# Patient Record
Sex: Female | Born: 2000 | Race: White | Hispanic: No | Marital: Single | Smoking: Never smoker
Health system: Southern US, Community
[De-identification: ages and names within clinical notes are randomized; demographics above are authoritative.]

## PROBLEM LIST (undated history)

## (undated) ENCOUNTER — Inpatient Hospital Stay (HOSPITAL_COMMUNITY): Payer: Self-pay

## (undated) DIAGNOSIS — A4902 Methicillin resistant Staphylococcus aureus infection, unspecified site: Secondary | ICD-10-CM

## (undated) DIAGNOSIS — J45909 Unspecified asthma, uncomplicated: Secondary | ICD-10-CM

## (undated) HISTORY — PX: WISDOM TOOTH EXTRACTION: SHX21

## (undated) HISTORY — DX: Unspecified asthma, uncomplicated: J45.909

---
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
## 2018-01-05 ENCOUNTER — Other Ambulatory Visit: Payer: Medicaid Other

## 2018-01-05 ENCOUNTER — Ambulatory Visit (INDEPENDENT_AMBULATORY_CARE_PROVIDER_SITE_OTHER): Payer: Medicaid Other | Admitting: Obstetrics & Gynecology

## 2018-01-05 ENCOUNTER — Encounter: Payer: Self-pay | Admitting: Obstetrics & Gynecology

## 2018-01-05 VITALS — BP 110/70 | HR 99 | Wt 193.0 lb

## 2018-01-05 DIAGNOSIS — O365920 Maternal care for other known or suspected poor fetal growth, second trimester, not applicable or unspecified: Secondary | ICD-10-CM

## 2018-01-05 DIAGNOSIS — Z331 Pregnant state, incidental: Secondary | ICD-10-CM

## 2018-01-05 DIAGNOSIS — Z1389 Encounter for screening for other disorder: Secondary | ICD-10-CM

## 2018-01-05 DIAGNOSIS — Z3A22 22 weeks gestation of pregnancy: Secondary | ICD-10-CM

## 2018-01-05 DIAGNOSIS — Z3402 Encounter for supervision of normal first pregnancy, second trimester: Secondary | ICD-10-CM

## 2018-01-05 LAB — POCT URINALYSIS DIPSTICK
Blood, UA: NEGATIVE
Glucose, UA: NEGATIVE
Ketones, UA: NEGATIVE
Leukocytes, UA: NEGATIVE
Nitrite, UA: NEGATIVE
Protein, UA: NEGATIVE

## 2018-01-05 NOTE — Progress Notes (Signed)
Patient ID: Yvonne Ayala, female   DOB: 10/03/2000, 17 y.o.   MRN: [id number] G1P0 3854w3d Estimated Date of Delivery: 05/08/18  Blood pressure 110/70, pulse 99, weight 193 lb (87.5 kg), last menstrual period 08/01/2017.   BP weight and urine results all reviewed and noted.  Please refer to the obstetrical flow sheet for the fundal height and fetal heart rate documentation:  Patient reports good fetal movement, denies any bleeding and no rupture of membranes symptoms or regular contractions. Patient is without complaints. All questions were answered.  Orders Placed This Encounter  Procedures  . US OB Follow Up  . POCT urinalysis dipstick    Plan:  Continued routine obstetrical care, sonogram for EFW next visit, PN2  Return in about 1 month (around 02/02/2018) for sonogram for growth, PN2, LROB.

## 2018-02-02 ENCOUNTER — Encounter: Payer: Self-pay | Admitting: Women's Health

## 2018-02-02 ENCOUNTER — Other Ambulatory Visit: Payer: Self-pay

## 2018-02-02 ENCOUNTER — Ambulatory Visit (INDEPENDENT_AMBULATORY_CARE_PROVIDER_SITE_OTHER): Payer: Medicaid Other | Admitting: Women's Health

## 2018-02-02 ENCOUNTER — Other Ambulatory Visit: Payer: Medicaid Other

## 2018-02-02 ENCOUNTER — Ambulatory Visit (INDEPENDENT_AMBULATORY_CARE_PROVIDER_SITE_OTHER): Payer: Medicaid Other

## 2018-02-02 VITALS — BP 100/60 | HR 101 | Wt 204.0 lb

## 2018-02-02 DIAGNOSIS — Z131 Encounter for screening for diabetes mellitus: Secondary | ICD-10-CM

## 2018-02-02 DIAGNOSIS — Z3402 Encounter for supervision of normal first pregnancy, second trimester: Secondary | ICD-10-CM

## 2018-02-02 DIAGNOSIS — L299 Pruritus, unspecified: Secondary | ICD-10-CM

## 2018-02-02 DIAGNOSIS — Z1389 Encounter for screening for other disorder: Secondary | ICD-10-CM

## 2018-02-02 DIAGNOSIS — Z331 Pregnant state, incidental: Secondary | ICD-10-CM

## 2018-02-02 DIAGNOSIS — O365920 Maternal care for other known or suspected poor fetal growth, second trimester, not applicable or unspecified: Secondary | ICD-10-CM

## 2018-02-02 DIAGNOSIS — O26892 Other specified pregnancy related conditions, second trimester: Secondary | ICD-10-CM

## 2018-02-02 DIAGNOSIS — Z3A26 26 weeks gestation of pregnancy: Secondary | ICD-10-CM

## 2018-02-02 LAB — POCT URINALYSIS DIPSTICK
Blood, UA: NEGATIVE
Glucose, UA: NEGATIVE
KETONES UA: NEGATIVE
Leukocytes, UA: NEGATIVE
NITRITE UA: NEGATIVE
PROTEIN UA: NEGATIVE

## 2018-02-02 NOTE — Progress Notes (Signed)
   LOW-RISK PREGNANCY VISIT Patient name: Yvonne Ayala MRN [id number]  Date of birth: [birth year] Chief Complaint:   Routine Prenatal Visit (PN2 & u/s today)  History of Present Illness:   Brady Plant is a 17 y.o. G1P0 female at [redacted]w[redacted]d with an Estimated Date of Delivery: 05/08/18 being seen today for ongoing management of a low-risk pregnancy.  Today she reports few circular areas on upper chest, occ itchy.  . Vag. Bleeding: None.  Movement: Present. denies leaking of fluid. Review of Systems:   Pertinent items are noted in HPI Denies abnormal vaginal discharge w/ itching/odor/irritation, headaches, visual changes, shortness of breath, chest pain, abdominal pain, severe nausea/vomiting, or problems with urination or bowel movements unless otherwise stated above. Pertinent History Reviewed:  Reviewed past medical,surgical, social, obstetrical and family history.  Reviewed problem list, medications and allergies. Physical Assessment:   Vitals:   02/02/18 1000  BP: 100/60  Pulse: (!) 101  Weight: 204 lb (92.5 kg)  Body mass index is 30.84 kg/m.        Physical Examination:   General appearance: Well appearing, and in no distress  Mental status: Alert, oriented to person, place, and time  Skin: Warm & dry, few circular non-raised spots upper chest between breasts  Cardiovascular: Normal heart rate noted  Respiratory: Normal respiratory effort, no distress  Abdomen: Soft, gravid, nontender  Pelvic: Cervical exam deferred         Extremities: Edema: None  Fetal Status: Fetal Heart Rate (bpm): 133 u/s Fundal Height: 26 cm Movement: Present     [place] 26+3 wks,cephalic,fhr 133 bpm,cx 4.3 cm,posterior pl gr 0,bilat adnexa's wnl,svp of fluid 6 cm,EFW 923 g 35% (done b/c efw low-normal at anatomy u/s)  Results for orders placed or performed in visit on 02/02/18 (from the past 24 hour(s))  POCT urinalysis dipstick   Collection Time: 02/02/18 10:03 AM  Result Value Ref Range   Color, UA      Clarity, UA     Glucose, UA neg    Bilirubin, UA     Ketones, UA neg    Spec Grav, UA  1.010 - 1.025   Blood, UA neg    pH, UA  5.0 - 8.0   Protein, UA neg    Urobilinogen, UA  0.2 or 1.0 E.U./dL   Nitrite, UA neg    Leukocytes, UA Negative Negative   Appearance     Odor      Assessment & Plan:  1) Low-risk pregnancy G1P0 at [redacted]w[redacted]d with an Estimated Date of Delivery: 05/08/18   2) Spots on skin upper chest, continue to monitor, can try hydrocortisone cream if needed for itching   Meds: No orders of the defined types were placed in this encounter.  Labs/procedures today: pn2, u/s  Plan:  Continue routine obstetrical care   Reviewed: Preterm labor symptoms and general obstetric precautions including but not limited to vaginal bleeding, contractions, leaking of fluid and fetal movement were reviewed in detail with the patient.  Recommended Tdap at HD/PCP per CDC guidelines. All questions were answered  Follow-up: Return in about 1 month (around 03/02/2018) for LROB.  Orders Placed This Encounter  Procedures  . POCT urinalysis dipstick   Cheral Marker CNM, [facility] 02/02/2018 10:25 AM

## 2018-02-02 NOTE — Progress Notes (Signed)
[place] 26+3 wks,cephalic,fhr 133 bpm,cx 4.3 cm,posterior pl gr 0,bilat adnexa's wnl,svp of fluid 6 cm,EFW 923 g 35%

## 2018-02-02 NOTE — Patient Instructions (Signed)
Yvonne Ayala, I greatly value your feedback.  If you receive a survey following your visit with [place] today, we appreciate you taking the time to fill it out.  Thanks, [name], CNM, WHNP-BC   Call the office [phone]) or go to [facility] if:  You begin to have strong, frequent contractions  Your water breaks.  Sometimes it is a big gush of fluid, sometimes it is just a trickle that keeps getting your panties wet or running down your legs  You have vaginal bleeding.  It is normal to have a small amount of spotting if your cervix was checked.   You don't feel your baby moving like normal.  If you don't, get you something to eat and drink and lay down and focus on feeling your baby move.  You should feel at least 10 movements in 2 hours.  If you don't, you should call the office or go to [facility].    Tdap Vaccine  It is recommended that you get the Tdap vaccine during the third trimester of EACH pregnancy to help protect your baby from getting pertussis (whooping cough)  27-36 weeks is the BEST time to do this so that you can pass the protection on to your baby. During pregnancy is better than after pregnancy, but if you are unable to get it during pregnancy it will be offered at the hospital.   You can get this vaccine at the health department or your family doctor  Everyone who will be around your baby should also be up-to-date on their vaccines. Adults (who are not pregnant) only need 1 dose of Tdap during adulthood.   Third Trimester of Pregnancy The third trimester is from week 29 through week 42, months 7 through 9. The third trimester is a time when the fetus is growing rapidly. At the end of the ninth month, the fetus is about 20 inches in length and weighs 6-10 pounds.  BODY CHANGES Your body goes through many changes during pregnancy. The changes vary from woman to woman.   Your weight will continue to increase. You can expect to gain 25-35 pounds (11-16 kg) by  the end of the pregnancy.  You may begin to get stretch marks on your hips, abdomen, and breasts.  You may urinate more often because the fetus is moving lower into your pelvis and pressing on your bladder.  You may develop or continue to have heartburn as a result of your pregnancy.  You may develop constipation because certain hormones are causing the muscles that push waste through your intestines to slow down.  You may develop hemorrhoids or swollen, bulging veins (varicose veins).  You may have pelvic pain because of the weight gain and pregnancy hormones relaxing your joints between the bones in your pelvis. Backaches may result from overexertion of the muscles supporting your posture.  You may have changes in your hair. These can include thickening of your hair, rapid growth, and changes in texture. Some women also have hair loss during or after pregnancy, or hair that feels dry or thin. Your hair will most likely return to normal after your baby is born.  Your breasts will continue to grow and be tender. A yellow discharge may leak from your breasts called colostrum.  Your belly button may stick out.  You may feel short of breath because of your expanding uterus.  You may notice the fetus "dropping," or moving lower in your abdomen.  You may have a bloody mucus  discharge. This usually occurs a few days to a week before labor begins.  Your cervix becomes thin and soft (effaced) near your due date. WHAT TO EXPECT AT YOUR PRENATAL EXAMS  You will have prenatal exams every 2 weeks until week 36. Then, you will have weekly prenatal exams. During a routine prenatal visit:  You will be weighed to make sure you and the fetus are growing normally.  Your blood pressure is taken.  Your abdomen will be measured to track your baby's growth.  The fetal heartbeat will be listened to.  Any test results from the previous visit will be discussed.  You may have a cervical check near your  due date to see if you have effaced. At around 36 weeks, your caregiver will check your cervix. At the same time, your caregiver will also perform a test on the secretions of the vaginal tissue. This test is to determine if a type of bacteria, Group B streptococcus, is present. Your caregiver will explain this further. Your caregiver may ask you:  What your birth plan is.  How you are feeling.  If you are feeling the baby move.  If you have had any abnormal symptoms, such as leaking fluid, bleeding, severe headaches, or abdominal cramping.  If you have any questions. Other tests or screenings that may be performed during your third trimester include:  Blood tests that check for low iron levels (anemia).  Fetal testing to check the health, activity level, and growth of the fetus. Testing is done if you have certain medical conditions or if there are problems during the pregnancy. FALSE LABOR You may feel small, irregular contractions that eventually go away. These are called Braxton Hicks contractions, or false labor. Contractions may last for hours, days, or even weeks before true labor sets in. If contractions come at regular intervals, intensify, or become painful, it is best to be seen by your caregiver.  SIGNS OF LABOR   Menstrual-like cramps.  Contractions that are 5 minutes apart or less.  Contractions that start on the top of the uterus and spread down to the lower abdomen and back.  A sense of increased pelvic pressure or back pain.  A watery or bloody mucus discharge that comes from the vagina. If you have any of these signs before the 37th week of pregnancy, call your caregiver right away. You need to go to the hospital to get checked immediately. HOME CARE INSTRUCTIONS   Avoid all smoking, herbs, alcohol, and unprescribed drugs. These chemicals affect the formation and growth of the baby.  Follow your caregiver's instructions regarding medicine use. There are medicines  that are either safe or unsafe to take during pregnancy.  Exercise only as directed by your caregiver. Experiencing uterine cramps is a good sign to stop exercising.  Continue to eat regular, healthy meals.  Wear a good support bra for breast tenderness.  Do not use hot tubs, steam rooms, or saunas.  Wear your seat belt at all times when driving.  Avoid raw meat, uncooked cheese, cat litter boxes, and soil used by cats. These carry germs that can cause birth defects in the baby.  Take your prenatal vitamins.  Try taking a stool softener (if your caregiver approves) if you develop constipation. Eat more high-fiber foods, such as fresh vegetables or fruit and whole grains. Drink plenty of fluids to keep your urine clear or pale yellow.  Take warm sitz baths to soothe any pain or discomfort caused by hemorrhoids. Use  hemorrhoid cream if your caregiver approves.  If you develop varicose veins, wear support hose. Elevate your feet for 15 minutes, 3-4 times a day. Limit salt in your diet.  Avoid heavy lifting, wear low heal shoes, and practice good posture.  Rest a lot with your legs elevated if you have leg cramps or low back pain.  Visit your dentist if you have not gone during your pregnancy. Use a soft toothbrush to brush your teeth and be gentle when you floss.  A sexual relationship may be continued unless your caregiver directs you otherwise.  Do not travel far distances unless it is absolutely necessary and only with the approval of your caregiver.  Take prenatal classes to understand, practice, and ask questions about the labor and delivery.  Make a trial run to the hospital.  Pack your hospital bag.  Prepare the baby's nursery.  Continue to go to all your prenatal visits as directed by your caregiver. SEEK MEDICAL CARE IF:  You are unsure if you are in labor or if your water has broken.  You have dizziness.  You have mild pelvic cramps, pelvic pressure, or nagging  pain in your abdominal area.  You have persistent nausea, vomiting, or diarrhea.  You have a bad smelling vaginal discharge.  You have pain with urination. SEEK IMMEDIATE MEDICAL CARE IF:   You have a fever.  You are leaking fluid from your vagina.  You have spotting or bleeding from your vagina.  You have severe abdominal cramping or pain.  You have rapid weight loss or gain.  You have shortness of breath with chest pain.  You notice sudden or extreme swelling of your face, hands, ankles, feet, or legs.  You have not felt your baby move in over an hour.  You have severe headaches that do not go away with medicine.  You have vision changes. Document Released: 09/13/2001 Document Revised: 09/24/2013 Document Reviewed: 11/20/2012 [facility] Patient Information 2015 [place], [place]. This information is not intended to replace advice given to you by your health care provider. Make sure you discuss any questions you have with your health care provider.

## 2018-02-03 LAB — GLUCOSE TOLERANCE, 2 HOURS W/ 1HR
GLUCOSE, 1 HOUR: 96 mg/dL (ref 65–179)
Glucose, 2 hour: 113 mg/dL (ref 65–152)
Glucose, Fasting: 80 mg/dL (ref 65–91)

## 2018-02-03 LAB — CBC
HEMATOCRIT: 35.2 % (ref 34.0–46.6)
Hemoglobin: 11.5 g/dL (ref 11.1–15.9)
MCH: 29.8 pg (ref 26.6–33.0)
MCHC: 32.7 g/dL (ref 31.5–35.7)
MCV: 91 fL (ref 79–97)
PLATELETS: 206 10*3/uL (ref 150–379)
RBC: 3.86 x10E6/uL (ref 3.77–5.28)
RDW: 14.1 % (ref 12.3–15.4)
WBC: 9.8 10*3/uL (ref 3.4–10.8)

## 2018-02-03 LAB — ANTIBODY SCREEN: ANTIBODY SCREEN: NEGATIVE

## 2018-02-03 LAB — HIV ANTIBODY (ROUTINE TESTING W REFLEX): HIV Screen 4th Generation wRfx: NONREACTIVE

## 2018-02-03 LAB — RPR: RPR Ser Ql: NONREACTIVE

## 2018-02-06 ENCOUNTER — Telehealth: Payer: Self-pay | Admitting: *Deleted

## 2018-02-06 NOTE — Telephone Encounter (Signed)
Patient states she is having some pulling type pain in her lower abdomen more on the sides.  She has been moving today but not more that usual.  Informed patient that it sounded like she was having round ligament pain which is normal during pregnancy.  Advised if the pain became more frequent and across her lower abdomen or she started having any bleeding, to call us or go to [facility].  Patient verbalized understanding.

## 2018-03-02 ENCOUNTER — Ambulatory Visit (INDEPENDENT_AMBULATORY_CARE_PROVIDER_SITE_OTHER): Payer: Medicaid Other | Admitting: Obstetrics & Gynecology

## 2018-03-02 ENCOUNTER — Other Ambulatory Visit: Payer: Self-pay

## 2018-03-02 ENCOUNTER — Encounter: Payer: Self-pay | Admitting: Obstetrics & Gynecology

## 2018-03-02 VITALS — BP 116/70 | HR 93 | Wt 215.0 lb

## 2018-03-02 DIAGNOSIS — Z3A30 30 weeks gestation of pregnancy: Secondary | ICD-10-CM

## 2018-03-02 DIAGNOSIS — Z3403 Encounter for supervision of normal first pregnancy, third trimester: Secondary | ICD-10-CM

## 2018-03-02 DIAGNOSIS — Z331 Pregnant state, incidental: Secondary | ICD-10-CM

## 2018-03-02 DIAGNOSIS — Z1389 Encounter for screening for other disorder: Secondary | ICD-10-CM

## 2018-03-02 LAB — POCT URINALYSIS DIPSTICK
Blood, UA: NEGATIVE
GLUCOSE UA: NEGATIVE
Ketones, UA: NEGATIVE
LEUKOCYTES UA: NEGATIVE
Nitrite, UA: NEGATIVE
Protein, UA: NEGATIVE

## 2018-03-02 NOTE — Progress Notes (Signed)
G1P0 2272w3d Estimated Date of Delivery: 05/08/18  Blood pressure 116/70, pulse 93, weight 215 lb (97.5 kg), last menstrual period 08/01/2017.   BP weight and urine results all reviewed and noted.  Please refer to the obstetrical flow sheet for the fundal height and fetal heart rate documentation:  Patient reports good fetal movement, denies any bleeding and no rupture of membranes symptoms or regular contractions. Patient is without complaints. All questions were answered.  Orders Placed This Encounter  Procedures  . POCT urinalysis dipstick    Plan:  Continued routine obstetrical care,   Return in about 2 weeks (around 03/16/2018) for LROB.

## 2018-03-16 ENCOUNTER — Ambulatory Visit (INDEPENDENT_AMBULATORY_CARE_PROVIDER_SITE_OTHER): Payer: Medicaid Other | Admitting: Obstetrics & Gynecology

## 2018-03-16 ENCOUNTER — Encounter: Payer: Self-pay | Admitting: Obstetrics & Gynecology

## 2018-03-16 VITALS — BP 108/65 | HR 90 | Wt 218.0 lb

## 2018-03-16 DIAGNOSIS — Z3A32 32 weeks gestation of pregnancy: Secondary | ICD-10-CM

## 2018-03-16 DIAGNOSIS — Z1389 Encounter for screening for other disorder: Secondary | ICD-10-CM

## 2018-03-16 DIAGNOSIS — Z23 Encounter for immunization: Secondary | ICD-10-CM

## 2018-03-16 DIAGNOSIS — Z3403 Encounter for supervision of normal first pregnancy, third trimester: Secondary | ICD-10-CM

## 2018-03-16 DIAGNOSIS — Z331 Pregnant state, incidental: Secondary | ICD-10-CM

## 2018-03-16 LAB — POCT URINALYSIS DIPSTICK
GLUCOSE UA: NEGATIVE
KETONES UA: NEGATIVE
Leukocytes, UA: NEGATIVE
NITRITE UA: NEGATIVE
Protein, UA: NEGATIVE
RBC UA: NEGATIVE

## 2018-03-16 NOTE — Progress Notes (Signed)
G1P0 240w3d Estimated Date of Delivery: 05/08/18  Blood pressure 108/65, pulse 90, weight 218 lb (98.9 kg), last menstrual period 08/01/2017.   BP weight and urine results all reviewed and noted.  Please refer to the obstetrical flow sheet for the fundal height and fetal heart rate documentation:  Patient reports good fetal movement, denies any bleeding and no rupture of membranes symptoms or regular contractions. Patient is without complaints. All questions were answered.  Orders Placed This Encounter  Procedures  . POCT urinalysis dipstick    Plan:  Continued routine obstetrical care,   Return in about 2 weeks (around 03/30/2018) for LROB.

## 2018-03-20 ENCOUNTER — Other Ambulatory Visit: Payer: Self-pay

## 2018-03-20 ENCOUNTER — Telehealth: Payer: Self-pay | Admitting: *Deleted

## 2018-03-20 ENCOUNTER — Encounter (HOSPITAL_COMMUNITY): Payer: Self-pay | Admitting: *Deleted

## 2018-03-20 ENCOUNTER — Observation Stay (HOSPITAL_COMMUNITY)
Admission: AD | Admit: 2018-03-20 | Discharge: 2018-03-21 | Disposition: A | Discharge status: Home / Self Care | Payer: Medicaid Other | Attending: Family Medicine | Admitting: Family Medicine

## 2018-03-20 DIAGNOSIS — Z043 Encounter for examination and observation following other accident: Secondary | ICD-10-CM | POA: Diagnosis not present

## 2018-03-20 DIAGNOSIS — J45909 Unspecified asthma, uncomplicated: Secondary | ICD-10-CM | POA: Insufficient documentation

## 2018-03-20 DIAGNOSIS — O99513 Diseases of the respiratory system complicating pregnancy, third trimester: Secondary | ICD-10-CM | POA: Insufficient documentation

## 2018-03-20 DIAGNOSIS — Z8614 Personal history of Methicillin resistant Staphylococcus aureus infection: Secondary | ICD-10-CM | POA: Insufficient documentation

## 2018-03-20 DIAGNOSIS — M545 Low back pain: Secondary | ICD-10-CM | POA: Diagnosis not present

## 2018-03-20 DIAGNOSIS — O26893 Other specified pregnancy related conditions, third trimester: Secondary | ICD-10-CM | POA: Insufficient documentation

## 2018-03-20 DIAGNOSIS — M549 Dorsalgia, unspecified: Secondary | ICD-10-CM

## 2018-03-20 DIAGNOSIS — N858 Other specified noninflammatory disorders of uterus: Secondary | ICD-10-CM

## 2018-03-20 DIAGNOSIS — W19XXXA Unspecified fall, initial encounter: Secondary | ICD-10-CM | POA: Insufficient documentation

## 2018-03-20 DIAGNOSIS — R10814 Left lower quadrant abdominal tenderness: Secondary | ICD-10-CM | POA: Insufficient documentation

## 2018-03-20 DIAGNOSIS — Z881 Allergy status to other antibiotic agents status: Secondary | ICD-10-CM | POA: Diagnosis not present

## 2018-03-20 DIAGNOSIS — O99891 Other specified diseases and conditions complicating pregnancy: Secondary | ICD-10-CM

## 2018-03-20 DIAGNOSIS — R110 Nausea: Secondary | ICD-10-CM | POA: Insufficient documentation

## 2018-03-20 DIAGNOSIS — O9989 Other specified diseases and conditions complicating pregnancy, childbirth and the puerperium: Secondary | ICD-10-CM

## 2018-03-20 DIAGNOSIS — Z3A33 33 weeks gestation of pregnancy: Secondary | ICD-10-CM | POA: Diagnosis not present

## 2018-03-20 DIAGNOSIS — N949 Unspecified condition associated with female genital organs and menstrual cycle: Secondary | ICD-10-CM

## 2018-03-20 DIAGNOSIS — N859 Noninflammatory disorder of uterus, unspecified: Secondary | ICD-10-CM

## 2018-03-20 LAB — URINALYSIS, ROUTINE W REFLEX MICROSCOPIC
Bilirubin Urine: NEGATIVE
GLUCOSE, UA: NEGATIVE mg/dL
Hgb urine dipstick: NEGATIVE
Ketones, ur: NEGATIVE mg/dL
Nitrite: NEGATIVE
PROTEIN: NEGATIVE mg/dL
Specific Gravity, Urine: 1.008 (ref 1.005–1.030)
pH: 6 (ref 5.0–8.0)

## 2018-03-20 LAB — TYPE AND SCREEN
ABO/RH(D): A POS
Antibody Screen: NEGATIVE

## 2018-03-20 LAB — CBC
HEMATOCRIT: 35.7 % — AB (ref 36.0–46.0)
Hemoglobin: 12.1 g/dL (ref 12.0–15.0)
MCH: 30.3 pg (ref 26.0–34.0)
MCHC: 33.9 g/dL (ref 30.0–36.0)
MCV: 89.3 fL (ref 78.0–100.0)
Platelets: 206 10*3/uL (ref 150–400)
RBC: 4 MIL/uL (ref 3.87–5.11)
RDW: 13.6 % (ref 11.5–15.5)
WBC: 13 10*3/uL — AB (ref 4.0–10.5)

## 2018-03-20 LAB — MRSA PCR SCREENING: MRSA by PCR: NEGATIVE

## 2018-03-20 LAB — ABO/RH: ABO/RH(D): A POS

## 2018-03-20 MED ORDER — ZOLPIDEM TARTRATE 5 MG PO TABS: 5.0000 mg | ORAL_TABLET | Freq: Every evening | ORAL | Status: DC | PRN

## 2018-03-20 MED ORDER — PRENATAL MULTIVITAMIN CH: 1.0000 | ORAL_TABLET | Freq: Every day | ORAL | Status: DC

## 2018-03-20 MED ORDER — DOCUSATE SODIUM 100 MG PO CAPS: 100.0000 mg | ORAL_CAPSULE | Freq: Every day | ORAL | Status: DC

## 2018-03-20 MED ORDER — CALCIUM CARBONATE ANTACID 500 MG PO CHEW: 2.0000 | CHEWABLE_TABLET | ORAL | Status: DC | PRN

## 2018-03-20 MED ORDER — CYCLOBENZAPRINE HCL 5 MG PO TABS
5.0000 mg | ORAL_TABLET | Freq: Three times a day (TID) | ORAL | Status: DC | PRN
  Administered 2018-03-20 – 2018-03-21 (×2): 5 mg via ORAL
  Filled 2018-03-20 (×3): qty 1

## 2018-03-20 MED ORDER — ACETAMINOPHEN 325 MG PO TABS: 650.0000 mg | ORAL_TABLET | ORAL | Status: DC | PRN

## 2018-03-20 NOTE — MAU Note (Signed)
Pt reports lower back pain after a fall about 1.5 hours ago, denies bleeding , positive fetal movement.

## 2018-03-20 NOTE — Telephone Encounter (Signed)
Pt called in stating that she fell today. Pt is [redacted] wks pregnant.   Discussed with Marylu LundJanet and pt was advised to [facility] to [facility].  Pt voiced understanding.  03-20-18  AS

## 2018-03-20 NOTE — H&P (Signed)
Yvonne KarvonenMakayla Tamanna Ayala is a 17 y.o. female presenting for low back pain and lower abdominal pain secondary to a fall she had today.  Landed on her buttocks and back.  No bleeding.  Good fetal movement. .  RN note: Pt reports lower back pain after a fall about 1.5 hours ago, denies bleeding , positive fetal movement.   OB History    Gravida  1   Para      Term      Preterm      AB      Living        SAB      TAB      Ectopic      Multiple      Live Births             Past Medical History:  Diagnosis Date  . Asthma    as a child  . Mental disorder   . MRSA infection    Past Surgical History:  Procedure Laterality Date  . NO PAST SURGERIES    . WISDOM TOOTH EXTRACTION     Family History: family history is not on file. She was adopted. Social History:  reports that she has never smoked. She has never used smokeless tobacco. She reports that she does not drink alcohol or use drugs.     Maternal Diabetes: No Genetic Screening: Normal Maternal Ultrasounds/Referrals: Normal Fetal Ultrasounds or other Referrals:  None Maternal Substance Abuse:  No Significant Maternal Medications:  None Significant Maternal Lab Results:  None Other Comments:  S/P Fall with contractions and tenderness  Review of Systems  Constitutional: Negative for chills and fever.  Eyes: Negative for blurred vision and double vision.  Respiratory: Negative for shortness of breath.   Cardiovascular: Negative for leg swelling.  Gastrointestinal: Positive for abdominal pain. Negative for constipation, diarrhea, nausea and vomiting.  Musculoskeletal: Positive for back pain.  Neurological: Negative for dizziness and weakness.   Maternal Medical History:  Reason for admission: Contractions.  Nausea.  Contractions: Onset was 1-2 hours ago.   Frequency: irregular.   Perceived severity is mild.    Fetal activity: Perceived fetal activity is normal.   Last perceived fetal movement was within the  past hour.    Prenatal complications: No bleeding, PIH, infection or placental abnormality.   Prenatal Complications - Diabetes: none.      Blood pressure 126/74, pulse (!) 103, temperature 98.5 F (36.9 C), temperature source Oral, resp. rate 16, height 5' 8.5" (1.74 m), weight 222 lb (100.7 kg), last menstrual period 08/01/2017, SpO2 100 %. Maternal Exam:  Uterine Assessment: Contraction strength is mild.  Contraction frequency is irregular.   Abdomen: Patient reports the following abdominal tenderness: LLQ and RLQ.  Fundal height is 33.    Introitus: Ferning test: not done.  Nitrazine test: not done. Amniotic fluid character: not assessed.     Fetal Exam Fetal Monitor Review: Mode: ultrasound.   Baseline rate: 140.  Variability: moderate (6-25 bpm).   Pattern: accelerations present and no decelerations.    Fetal State Assessment: Category I - tracings are normal.     Physical Exam  Constitutional: She is oriented to person, place, and time. She appears well-developed and well-nourished. No distress.  HENT:  Head: Normocephalic.  Cardiovascular: Normal rate and regular rhythm.  Respiratory: Effort normal. No respiratory distress. She has no wheezes. She has no rales.  GI: Soft. There is tenderness in the right lower quadrant and left lower quadrant. There is no  rebound and no guarding.  Musculoskeletal: Normal range of motion.  Neurological: She is alert and oriented to person, place, and time.  Skin: Skin is warm and dry.  Psychiatric: She has a normal mood and affect.    Prenatal labs: ABO, Rh: A/Positive/-- (01/14 1505) Antibody: Negative (05/03 0846) Rubella: 6.78 (01/14 1505) RPR: Non Reactive (05/03 0846)  HBsAg: Negative (01/14 1505)  HIV: Non Reactive (05/03 0846)  GBS:     Assessment/Plan: [place] IUP at [redacted]w[redacted]d S/P fall  Lower abdominal tenderness Low back pain Frequent uterine contractions  Admit to Antenatal per consult Dr Adrian Blackwater Routine  orders Flexeril prn back pain EFM and tocometry Probable discharge in AM if Stable   Wynelle Bourgeois 03/20/2018, 7:11 PM

## 2018-03-21 DIAGNOSIS — O99891 Other specified diseases and conditions complicating pregnancy: Secondary | ICD-10-CM

## 2018-03-21 DIAGNOSIS — M549 Dorsalgia, unspecified: Secondary | ICD-10-CM | POA: Diagnosis not present

## 2018-03-21 DIAGNOSIS — O9989 Other specified diseases and conditions complicating pregnancy, childbirth and the puerperium: Secondary | ICD-10-CM | POA: Diagnosis not present

## 2018-03-21 DIAGNOSIS — W19XXXA Unspecified fall, initial encounter: Secondary | ICD-10-CM | POA: Diagnosis not present

## 2018-03-21 MED ORDER — CYCLOBENZAPRINE HCL 5 MG PO TABS: 5.0000 mg | ORAL_TABLET | Freq: Three times a day (TID) | ORAL | 2 refills | Status: DC | PRN

## 2018-03-21 NOTE — Discharge Summary (Signed)
Antenatal Physician Discharge Summary  Patient ID: Yvonne Ayala MRN: [id number] DOB/AGE: 19/06/2001 19 y.o.  Admit date: 03/20/2018 Discharge date: 03/21/2018  Admission Diagnoses: Fall at 3760w0d  Discharge Diagnoses: The same  Prenatal Procedures: NST  Hospital Course:  This is a 17 y.o. G1P0 with IUP at 6260w0d admitted for observation after a fall into her back at home. No abdominal trauma.  She was admitted with some contractions, but these stopped during observation.  No leaking of fluid and no bleeding.  The fetal heart rate monitoring remained reassuring, and she had no signs/symptoms of progressing preterm labor or other maternal-fetal concerns.  Her cervical exam was unchanged from admission.  She was deemed stable for discharge to home with outpatient follow up.  Discharge Exam: Temp:  [98 F (36.7 C)-98.6 F (37 C)] 98 F (36.7 C) (06/19 0811) Pulse Rate:  [71-103] 71 (06/19 0811) Resp:  [16-18] 17 (06/19 0811) BP: (98-126)/(66-77) 98/66 (06/19 0811) SpO2:  [95 %-100 %] 98 % (06/19 0811) Weight:  [222 lb (100.7 kg)] 222 lb (100.7 kg) (06/18 1638) Physical Examination: CONSTITUTIONAL: Well-developed, well-nourished female in no acute distress.  HENT:  Normocephalic, atraumatic, External right and left ear normal. Oropharynx is clear and moist EYES: Conjunctivae and EOM are normal. Pupils are equal, round, and reactive to light. No scleral icterus.  NECK: Normal range of motion, supple, no masses SKIN: Skin is warm and dry. No rash noted. Not diaphoretic. No erythema. No pallor. NEUROLGIC: Alert and oriented to person, place, and time. Normal reflexes, muscle tone coordination. No cranial nerve deficit noted. PSYCHIATRIC: Normal mood and affect. Normal behavior. Normal judgment and thought content. CARDIOVASCULAR: Normal heart rate noted, regular rhythm RESPIRATORY: Effort and breath sounds normal, no problems with respiration noted MUSCULOSKELETAL: Normal range of motion.  No edema and no tenderness. 2+ distal pulses. ABDOMEN: Soft, nontender, nondistended, gravid. CERVIX:  Deferred  Fetal monitoring: FHR: 135 bpm, Variability: moderate, Accelerations: Present, Decelerations: Absent  Uterine activity:No contractions  Significant Diagnostic Studies:  Results for orders placed or performed during the hospital encounter of 03/20/18 (from the past 168 hour(s))  Urinalysis, Routine w reflex microscopic   Collection Time: 03/20/18  4:43 PM  Result Value Ref Range   Color, Urine YELLOW YELLOW   APPearance HAZY (A) CLEAR   Specific Gravity, Urine 1.008 1.005 - 1.030   pH 6.0 5.0 - 8.0   Glucose, UA NEGATIVE NEGATIVE mg/dL   Hgb urine dipstick NEGATIVE NEGATIVE   Bilirubin Urine NEGATIVE NEGATIVE   Ketones, ur NEGATIVE NEGATIVE mg/dL   Protein, ur NEGATIVE NEGATIVE mg/dL   Nitrite NEGATIVE NEGATIVE   Leukocytes, UA SMALL (A) NEGATIVE   RBC / HPF 0-5 0 - 5 RBC/hpf   WBC, UA 6-10 0 - 5 WBC/hpf   Bacteria, UA MANY (A) NONE SEEN   Squamous Epithelial / LPF 6-10 0 - 5   Mucus PRESENT   CBC on admission   Collection Time: 03/20/18  7:24 PM  Result Value Ref Range   WBC 13.0 (H) 4.0 - 10.5 K/uL   RBC 4.00 3.87 - 5.11 MIL/uL   Hemoglobin 12.1 12.0 - 15.0 g/dL   HCT 40.935.7 (L) 81.136.0 - 91.446.0 %   MCV 89.3 78.0 - 100.0 fL   MCH 30.3 26.0 - 34.0 pg   MCHC 33.9 30.0 - 36.0 g/dL   RDW 78.213.6 95.611.5 - 21.315.5 %   Platelets 206 150 - 400 K/uL  Type and screen [facility]   Collection Time: 03/20/18  7:24 PM  Result Value Ref Range   ABO/RH(D) A POS    Antibody Screen NEG    Sample Expiration      03/23/2018 Performed at [facility], [address], [place], [place] [address]   ABO/Rh   Collection Time: 03/20/18  7:24 PM  Result Value Ref Range   ABO/RH(D)      A POS Performed at [facility], [address], [place], [place] [address]   MRSA PCR Screening   Collection Time: 03/20/18  8:19 PM  Result Value Ref Range   MRSA by PCR  NEGATIVE NEGATIVE  Results for orders placed or performed in visit on 03/16/18 (from the past 168 hour(s))  POCT urinalysis dipstick   Collection Time: 03/16/18 10:27 AM  Result Value Ref Range   Color, UA     Clarity, UA     Glucose, UA Negative Negative   Bilirubin, UA     Ketones, UA neg    Spec Grav, UA  1.010 - 1.025   Blood, UA neg    pH, UA  5.0 - 8.0   Protein, UA Negative Negative   Urobilinogen, UA  0.2 or 1.0 E.U./dL   Nitrite, UA neg    Leukocytes, UA Negative Negative   Appearance     Odor      Future Appointments  Date Time Provider Department Center  03/30/2018 10:45 AM Tilda Burrow, MD [facility]    Discharge Condition: Stable  Disposition: Discharge disposition: 01-Home or Self Care       Discharge Instructions    Discharge activity:  No Restrictions   Complete by:  As directed    Fetal Kick Count:  Lie on our left side for one hour after a meal, and count the number of times your baby kicks.  If it is less than 5 times, get up, move around and drink some juice.  Repeat the test 30 minutes later.  If it is still less than 5 kicks in an hour, notify your doctor.   Complete by:  As directed    No sexual activity restrictions   Complete by:  As directed    Notify physician for leaking of fluid   Complete by:  As directed    Notify physician for vaginal bleeding   Complete by:  As directed    PRETERM LABOR:  Includes any of the follwing symptoms that occur between 20 - [redacted] weeks gestation.  If these symptoms are not stopped, preterm labor can result in preterm delivery, placing your baby at risk   Complete by:  As directed      Allergies as of 03/21/2018      Reactions   Vancomycin Itching      Medication List    TAKE these medications   cyclobenzaprine 5 MG tablet Commonly known as:  FLEXERIL Take 1-2 tablets (5-10 mg total) by mouth every 8 (eight) hours as needed for muscle spasms.   prenatal vitamin w/FE, FA 27-1 MG Tabs  tablet Take 1 tablet by mouth daily at 12 noon.        Signed: Jaynie Collins M.D. 03/21/2018, 11:18 AM

## 2018-03-21 NOTE — Progress Notes (Signed)
Discharge instructions given, follow up appoint scheduled, PTL  And fetal kick count instructions reviewed. Pt verbalizes understanding.

## 2018-03-21 NOTE — Discharge Instructions (Signed)

## 2018-03-30 ENCOUNTER — Encounter (INDEPENDENT_AMBULATORY_CARE_PROVIDER_SITE_OTHER): Payer: Self-pay

## 2018-03-30 ENCOUNTER — Ambulatory Visit (INDEPENDENT_AMBULATORY_CARE_PROVIDER_SITE_OTHER): Payer: Medicaid Other | Admitting: Obstetrics and Gynecology

## 2018-03-30 ENCOUNTER — Encounter: Payer: Self-pay | Admitting: Obstetrics and Gynecology

## 2018-03-30 VITALS — BP 107/69 | HR 88 | Wt 222.6 lb

## 2018-03-30 DIAGNOSIS — Z1389 Encounter for screening for other disorder: Secondary | ICD-10-CM

## 2018-03-30 DIAGNOSIS — Z3A34 34 weeks gestation of pregnancy: Secondary | ICD-10-CM

## 2018-03-30 DIAGNOSIS — Z331 Pregnant state, incidental: Secondary | ICD-10-CM

## 2018-03-30 DIAGNOSIS — Z3403 Encounter for supervision of normal first pregnancy, third trimester: Secondary | ICD-10-CM

## 2018-03-30 LAB — POCT URINALYSIS DIPSTICK
Glucose, UA: NEGATIVE
KETONES UA: NEGATIVE
Leukocytes, UA: NEGATIVE
Nitrite, UA: NEGATIVE
Protein, UA: NEGATIVE
RBC UA: NEGATIVE

## 2018-03-30 NOTE — Patient Instructions (Signed)
CHILDBIRTH CLASSES ° °[facility] °Call to Register: [phone] or [phone] or Register Online: [email or web address] ° °THESE CLASSES FILL UP VERY QUICKLY, SO SIGN UP AS SOON AS YOU CAN!!! ° °*Please visit [facility] pregnancy website at [email or web address]* ° °Option 1: Birth & Baby Series °• This series of 3 weekly classes helps you and your labor partner prepare for childbirth at [facility]. °• Reviews newborn care, labor & birth, pain management, and comfort techniques °• Maternity Care Center Tour of [facility] is included. °• Cost: $60 per couple for insured or self-pay, $30 per couple for Medicaid ° °Option 2: Weekend Birth & Baby °• This class is a weekend version of our Birth & Baby series. It is designed for parents who have a difficult time fitting several weeks of classes into their schedule.  °• Maternity Care Center Tour of [facility] is included.  °• Friday 6:30pm-8:30pm, Saturday 9am-4pm °• Cost: $75 per couple for insured or self-pay, $30 per couple for Medicaid ° °Option 3: Natural Childbirth °• This series of 5 weekly classes is for expectant parents who want to learn and practice natural methods of coping with the process of labor and childbirth. °• Maternity Care Center Tour of [facility] is included. °• Cost: $75 per couple for insured or self-pay, $30 per couple for Medicaid ° °Option 4: Online Birth & Baby °• This online class offers you the freedom to complete a Birth & Baby series in the comfort of your own home. The flexibility of this option allows you to review sections at your own place, at times convenient to you and your support people.  °• Cost: $60 for 60 days of online access ° ° ° °Other Available Classes °Baby & Me °Enjoy this time to discuss newborn & infant parenting topics and family adjustment issues with other new mothers in a relaxed environment. Each week brings a new speaker or baby-centered activity. We encourage  mothers and their babies (birth to crawling) to join us every Thursday in the [facility] at 11:00 am. You are welcome to visit this group even if you haven't delivered yet! It's wonderful to make new friends early and watch other moms interact with their babies. No registration or fee.  ° °Big Brother/ Big Sister °Let your children share in the joy of a new brother or sister in this special class designed just for them. This class is designed for children ages 2-6, but any age is welcome. Please register each child individually. ° °Breastfeeding Support Group °This group is a mother-to-mother support circle where moms have the opportunity to share their breastfeeding experiences. A breastfeeding Support nurse is present for questions and concerns. Meets each Tuesday at 11:00 am. No fee or registration. ° °Breastfeeding Your Baby °Breastfeeding is a special time for mother and child. This class will help you feel ready to begin this important relationship. Your partner is encouraged to attend with you.  ° °Caring For Baby °This class is for expectant  and adoptive parents who want to learn and practice the most up-to-date newborn care for their babies. Register only the mom-to-be and your partner can come with you. (Note: This class is included in the Birth & Baby series and the Weekend Birth & Baby classes.) ° °Comfort Techniques & Tour °This 2-hour interactive class will provide you the opportunity to learn & practice hands-on techniques with your partner that can help relieve some discomfort of labor and encourage your baby to rotate toward   the best position for birth. A tour of the [facility] is included.  ° °Daddy Boot Camp °This course offers Dads-to-be the tools and knowledge needed to feel confident on their journey to becoming new fathers. ° °Grandparent Love °Expecting a grandbaby? Learn about the latest infant care and safety recommendation and ways to  support your own child as he or she transitions into the parenting role.  ° °Infant and Child CPR °Parents, grandparents, babysitters, and friends learn Cardio-Pulmonary Resuscitation skills for infants and children. Register each participant individually. (Note: This Family & Friends program does not offer certification.) ° °Marvelous Multiples °Expecting twins, triplets, or more? This class covers the differences in labor, birth, parenting, and breastfeeding issues that face multiples' parents. NICU tour is included.  ° °Mom Talk °This mom-led group offers support and connection to mothers as they journey through the adjustments and struggles of that sometimes overwhelming first year after the birth of a child. A member of our [facility] staff will be present to share resources and additional support if needed, as you care for yourself and baby. You are welcome to visit the group before you deliver! It's wonderful to meet new friends early and watch other moms interact with their babies. It's held at [facility] at 10:00 am each Tuesday morning and 6:00 pm each Thursday evening. Babies (birth to crawling) welcome. No registration or fee.  ° °Waterbirth Classes °Interested in a waterbirth? This informational class will help you discover whether waterbirth is the right fir for you.  ° °[facility] Virtual Maternity Tour °View a virtual tour of [facility]. In-person tours are available for participants of childbirth education classes.  ° °

## 2018-03-30 NOTE — Progress Notes (Signed)
Patient ID: Yvonne Ayala, female   DOB: 11/15/2000, 17 y.o.   MRN: [id number]    LOW-RISK PREGNANCY VISIT Patient name: Yvonne Ayala MRN [id number]  Date of birth: 08/01/2001 Chief Complaint:   routine prenatal visit  History of Present Illness:   Yvonne Ayala is a 17 y.o. G1P0 female at 5653w3d with an Estimated Date of Delivery: 05/08/18 being seen today for ongoing management of a low-risk pregnancy.  Today she reports no complaints. She has not done childbirth classes, but has support from her mom and the father of the baby. The patient denies fever, chills or any other symptoms or complaints at this time.    Contractions: Not present. Vag. Bleeding: None.  Movement: Present  denies leaking of fluid. Review of Systems:   Pertinent items are noted in HPI Denies abnormal vaginal discharge w/ itching/odor/irritation, headaches, visual changes, shortness of breath, chest pain, abdominal pain, severe nausea/vomiting, or problems with urination or bowel movements unless otherwise stated above. Pertinent History Reviewed:  Reviewed past medical,surgical, social, obstetrical and family history.  Reviewed problem list, medications and allergies. Physical Assessment:   Vitals:   03/30/18 1052  BP: 107/69  Pulse: 88  Weight: 222 lb 9.6 oz (101 kg)  Body mass index is 33.35 kg/m.        Physical Examination:   General appearance: Well appearing, and in no distress  Mental status: Alert, oriented to person, place, and time  Skin: Warm & dry  Cardiovascular: Normal heart rate noted  Respiratory: Normal respiratory effort, no distress  Abdomen: Soft, gravid, nontender  Pelvic: Cervical exam deferred         Extremities: Edema: Trace  Fetal Status: Fetal Heart Rate (bpm): 138 Fundal Height: 33 cm Movement: Present    Results for orders placed or performed in visit on 03/30/18 (from the past 24 hour(s))  POCT urinalysis dipstick   Collection Time: 03/30/18 10:54 AM  Result Value  Ref Range   Color, UA     Clarity, UA     Glucose, UA Negative Negative   Bilirubin, UA     Ketones, UA neg    Spec Grav, UA  1.010 - 1.025   Blood, UA neg    pH, UA  5.0 - 8.0   Protein, UA Negative Negative   Urobilinogen, UA  0.2 or 1.0 E.U./dL   Nitrite, UA neg    Leukocytes, UA Negative Negative   Appearance     Odor      Assessment & Plan:  1) Low-risk pregnancy G1P0 at 2253w3d with an Estimated Date of Delivery: 05/08/18    Meds: No orders of the defined types were placed in this encounter.  Labs/procedures today: none  Plan:  Continue routine obstetrical care   Follow-up: Return in about 2 weeks (around 04/13/2018) for LROB.  Orders Placed This Encounter  Procedures  . POCT urinalysis dipstick   By signing my name below, I, Pietro CassisEmily Tufford, attest that this documentation has been prepared under the direction and in the presence of Tilda BurrowFerguson, Aisley Whan V, MD. Electronically Signed: Pietro CassisEmily Tufford, Medical Scribe. 03/30/18. 11:13 AM.  I personally performed the services described in this documentation, which was SCRIBED in my presence. The recorded information has been reviewed and considered accurate. It has been edited as necessary during review. Tilda BurrowJohn V Mays Paino, MD

## 2018-04-13 ENCOUNTER — Ambulatory Visit (INDEPENDENT_AMBULATORY_CARE_PROVIDER_SITE_OTHER): Payer: Medicaid Other | Admitting: Women's Health

## 2018-04-13 ENCOUNTER — Encounter: Payer: Self-pay | Admitting: Women's Health

## 2018-04-13 VITALS — BP 121/79 | HR 113 | Wt 230.0 lb

## 2018-04-13 DIAGNOSIS — Z331 Pregnant state, incidental: Secondary | ICD-10-CM

## 2018-04-13 DIAGNOSIS — Z3403 Encounter for supervision of normal first pregnancy, third trimester: Secondary | ICD-10-CM

## 2018-04-13 DIAGNOSIS — Z3483 Encounter for supervision of other normal pregnancy, third trimester: Secondary | ICD-10-CM

## 2018-04-13 DIAGNOSIS — Z1389 Encounter for screening for other disorder: Secondary | ICD-10-CM

## 2018-04-13 DIAGNOSIS — Z3A36 36 weeks gestation of pregnancy: Secondary | ICD-10-CM

## 2018-04-13 LAB — POCT URINALYSIS DIPSTICK
Glucose, UA: NEGATIVE
KETONES UA: NEGATIVE
Leukocytes, UA: NEGATIVE
Nitrite, UA: NEGATIVE
Protein, UA: POSITIVE — AB
RBC UA: NEGATIVE

## 2018-04-13 NOTE — Patient Instructions (Addendum)
Yvonne Ayala, I greatly value your feedback.  If you receive a survey following your visit with [place] today, we appreciate you taking the time to fill it out.  Thanks, [name], CNM, WHNP-BC   Call the office [phone]) or go to [facility] if:  You begin to have strong, frequent contractions  Your water breaks.  Sometimes it is a big gush of fluid, sometimes it is just a trickle that keeps getting your panties wet or running down your legs  You have vaginal bleeding.  It is normal to have a small amount of spotting if your cervix was checked.   You don't feel your baby moving like normal.  If you don't, get you something to eat and drink and lay down and focus on feeling your baby move.  You should feel at least 10 movements in 2 hours.  If you don't, you should call the office or go to [facility].    For your lower back pain/pressure you may:  Purchase a pregnancy belt from [employer], [employer], [employer], etc and wear it while you are up and about  Take warm baths  Use a heating pad to your lower back for no longer than 20 minutes at a time, and do not place near abdomen  Take tylenol as needed. Please follow directions on the bottle  Kinesiology tape (can get from sporting goods store), google how to tape belly for pregnancy     Preterm Labor and Birth Information The normal length of a pregnancy is 39-41 weeks. Preterm labor is when labor starts before 37 completed weeks of pregnancy. What are the risk factors for preterm labor? Preterm labor is more likely to occur in women who:  Have certain infections during pregnancy such as a bladder infection, sexually transmitted infection, or infection inside the uterus (chorioamnionitis).  Have a shorter-than-normal cervix.  Have gone into preterm labor before.  Have had surgery on their cervix.  Are younger than age 62 or older than age 23.  Are African American.  Are pregnant with twins or multiple  babies (multiple gestation).  Take street drugs or smoke while pregnant.  Do not gain enough weight while pregnant.  Became pregnant shortly after having been pregnant.  What are the symptoms of preterm labor? Symptoms of preterm labor include:  Cramps similar to those that can happen during a menstrual period. The cramps may happen with diarrhea.  Pain in the abdomen or lower back.  Regular uterine contractions that may feel like tightening of the abdomen.  A feeling of increased pressure in the pelvis.  Increased watery or bloody mucus discharge from the vagina.  Water breaking (ruptured amniotic sac).  Why is it important to recognize signs of preterm labor? It is important to recognize signs of preterm labor because babies who are born prematurely may not be fully developed. This can put them at an increased risk for:  Long-term (chronic) heart and lung problems.  Difficulty immediately after birth with regulating body systems, including blood sugar, body temperature, heart rate, and breathing rate.  Bleeding in the brain.  Cerebral palsy.  Learning difficulties.  Death.  These risks are highest for babies who are born before 34 weeks of pregnancy. How is preterm labor treated? Treatment depends on the length of your pregnancy, your condition, and the health of your baby. It may involve:  Having a stitch (suture) placed in your cervix to prevent your cervix from opening too early (cerclage).  Taking or being  given medicines, such as: ? Hormone medicines. These may be given early in pregnancy to help support the pregnancy. ? Medicine to stop contractions. ? Medicines to help mature the baby's lungs. These may be prescribed if the risk of delivery is high. ? Medicines to prevent your baby from developing cerebral palsy.  If the labor happens before 34 weeks of pregnancy, you may need to stay in the hospital. What should I do if I think I am in preterm labor? If  you think that you are going into preterm labor, call your health care provider right away. How can I prevent preterm labor in future pregnancies? To increase your chance of having a full-term pregnancy:  Do not use any tobacco products, such as cigarettes, chewing tobacco, and e-cigarettes. If you need help quitting, ask your health care provider.  Do not use street drugs or medicines that have not been prescribed to you during your pregnancy.  Talk with your health care provider before taking any herbal supplements, even if you have been taking them regularly.  Make sure you gain a healthy amount of weight during your pregnancy.  Watch for infection. If you think that you might have an infection, get it checked right away.  Make sure to tell your health care provider if you have gone into preterm labor before.  This information is not intended to replace advice given to you by your health care provider. Make sure you discuss any questions you have with your health care provider. Document Released: 12/10/2003 Document Revised: 03/01/2016 Document Reviewed: 02/10/2016 Elsevier Interactive Patient Education  2018 [employer]

## 2018-04-13 NOTE — Progress Notes (Signed)
   LOW-RISK PREGNANCY VISIT Patient name: Yvonne Ayala MRN [id number]  Date of birth: 07/04/2001 Chief Complaint:   low risk ob (pelvic pressure)  History of Present Illness:   Yvonne Ayala is a 17 y.o. G1P0 female at 1922w3d with an Estimated Date of Delivery: 05/08/18 being seen today for ongoing management of a low-risk pregnancy.  Today she reports pressure. Contractions: Not present.  .  Movement: Present. denies leaking of fluid. Review of Systems:   Pertinent items are noted in HPI Denies abnormal vaginal discharge w/ itching/odor/irritation, headaches, visual changes, shortness of breath, chest pain, abdominal pain, severe nausea/vomiting, or problems with urination or bowel movements unless otherwise stated above. Pertinent History Reviewed:  Reviewed past medical,surgical, social, obstetrical and family history.  Reviewed problem list, medications and allergies. Physical Assessment:   Vitals:   04/13/18 1049  BP: 121/79  Pulse: (!) 113  Weight: 230 lb (104.3 kg)  Body mass index is 34.46 kg/m.        Physical Examination:   General appearance: Well appearing, and in no distress  Mental status: Alert, oriented to person, place, and time  Skin: Warm & dry  Cardiovascular: Normal heart rate noted  Respiratory: Normal respiratory effort, no distress  Abdomen: Soft, gravid, nontender  Pelvic: Cervical exam performed  Dilation: Fingertip Effacement (%): Thick Station: -2  Extremities: Edema: None  Fetal Status: Fetal Heart Rate (bpm): 158 Fundal Height: 35 cm Movement: Present Presentation: Vertex  Results for orders placed or performed in visit on 04/13/18 (from the past 24 hour(s))  POCT urinalysis dipstick   Collection Time: 04/13/18 10:58 AM  Result Value Ref Range   Color, UA     Clarity, UA     Glucose, UA Negative Negative   Bilirubin, UA     Ketones, UA neg    Spec Grav, UA  1.010 - 1.025   Blood, UA neg    pH, UA  5.0 - 8.0   Protein, UA Positive (A)  Negative   Urobilinogen, UA  0.2 or 1.0 E.U./dL   Nitrite, UA neg    Leukocytes, UA Negative Negative   Appearance     Odor      Assessment & Plan:  1) Low-risk pregnancy G1P0 at 7822w3d with an Estimated Date of Delivery: 05/08/18   2) Pressure, can try kinesiology tape   Meds: No orders of the defined types were placed in this encounter.  Labs/procedures today: sve, gc/ct, gbs  Plan:  Continue routine obstetrical care   Reviewed: Preterm labor symptoms and general obstetric precautions including but not limited to vaginal bleeding, contractions, leaking of fluid and fetal movement were reviewed in detail with the patient.  All questions were answered  Follow-up: Return in about 1 week (around 04/20/2018) for LROB.  Orders Placed This Encounter  Procedures  . POCT urinalysis dipstick   Cheral MarkerKimberly R Mendi Constable CNM, [facility]NP-BC 04/13/2018 11:36 AM

## 2018-04-13 NOTE — Addendum Note (Signed)
Addended by: Federico FlakeNES, PEGGY A on: 04/13/2018 11:49 AM   Modules accepted: Orders

## 2018-04-14 ENCOUNTER — Inpatient Hospital Stay (HOSPITAL_COMMUNITY)
Admission: AD | Admit: 2018-04-14 | Discharge: 2018-04-14 | Disposition: A | Discharge status: Home / Self Care | Payer: Medicaid Other | Source: Ambulatory Visit | Attending: Obstetrics & Gynecology | Admitting: Obstetrics & Gynecology

## 2018-04-14 ENCOUNTER — Encounter (HOSPITAL_COMMUNITY): Payer: Self-pay

## 2018-04-14 ENCOUNTER — Other Ambulatory Visit: Payer: Self-pay

## 2018-04-14 DIAGNOSIS — Z3483 Encounter for supervision of other normal pregnancy, third trimester: Secondary | ICD-10-CM | POA: Diagnosis present

## 2018-04-14 DIAGNOSIS — Z3A37 37 weeks gestation of pregnancy: Secondary | ICD-10-CM | POA: Diagnosis not present

## 2018-04-14 DIAGNOSIS — O479 False labor, unspecified: Secondary | ICD-10-CM

## 2018-04-14 LAB — URINALYSIS, ROUTINE W REFLEX MICROSCOPIC
BILIRUBIN URINE: NEGATIVE
GLUCOSE, UA: NEGATIVE mg/dL
HGB URINE DIPSTICK: NEGATIVE
KETONES UR: NEGATIVE mg/dL
Nitrite: NEGATIVE
PH: 5 (ref 5.0–8.0)
Protein, ur: 30 mg/dL — AB
SPECIFIC GRAVITY, URINE: 1.026 (ref 1.005–1.030)

## 2018-04-14 NOTE — Progress Notes (Addendum)
G1 @ 36.[redacted] wksga. Here dt pressure in the vagina and "part of mucus plug came out". Denies bleeding or LOF. +FM  [facility]   EFM applied.  VSS See flow sheet for VS details.   1440: up to bathroom. verified labels with the pt and affixed it to the cup. Nurse Techs made aware.   VE: 1/30/-2   1455: provider notified. Report given. Orders received to ask pt if desires to walk for an hr and be rechecked.   D/c orders received from CNM D. Hart RochesterLawson.   1504: D/c instructions given with pt understanding. Pt left unit via ambulatory with family.

## 2018-04-15 LAB — STREP GP B NAA: Strep Gp B NAA: NEGATIVE

## 2018-04-17 LAB — GC/CHLAMYDIA PROBE AMP
Chlamydia trachomatis, NAA: NEGATIVE
Neisseria gonorrhoeae by PCR: NEGATIVE

## 2018-04-20 ENCOUNTER — Encounter: Payer: Self-pay | Admitting: Women's Health

## 2018-04-20 ENCOUNTER — Ambulatory Visit (INDEPENDENT_AMBULATORY_CARE_PROVIDER_SITE_OTHER): Payer: Medicaid Other | Admitting: Women's Health

## 2018-04-20 VITALS — BP 107/71 | HR 99 | Wt 236.8 lb

## 2018-04-20 DIAGNOSIS — Z1389 Encounter for screening for other disorder: Secondary | ICD-10-CM

## 2018-04-20 DIAGNOSIS — Z3A37 37 weeks gestation of pregnancy: Secondary | ICD-10-CM

## 2018-04-20 DIAGNOSIS — Z331 Pregnant state, incidental: Secondary | ICD-10-CM

## 2018-04-20 DIAGNOSIS — Z3403 Encounter for supervision of normal first pregnancy, third trimester: Secondary | ICD-10-CM

## 2018-04-20 LAB — POCT URINALYSIS DIPSTICK OB
Blood, UA: NEGATIVE
GLUCOSE, UA: NEGATIVE — AB
Ketones, UA: NEGATIVE
LEUKOCYTES UA: NEGATIVE
Nitrite, UA: NEGATIVE

## 2018-04-20 NOTE — Patient Instructions (Signed)
Yvonne Ayala, I greatly value your feedback.  If you receive a survey following your visit with us today, we appreciate you taking the time to fill it out.  Thanks, [name] Jleigh Striplin, CNM, WHNP-BC   Call the office [phone]) or go to [facility] if:  You begin to have strong, frequent contractions  Your water breaks.  Sometimes it is a big gush of fluid, sometimes it is just a trickle that keeps getting your panties wet or running down your legs  You have vaginal bleeding.  It is normal to have a small amount of spotting if your cervix was checked.   You don't feel your baby moving like normal.  If you don't, get you something to eat and drink and lay down and focus on feeling your baby move.  You should feel at least 10 movements in 2 hours.  If you don't, you should call the office or go to [facility].     [facility] Cont[facility] Contractions of the uterus can occur throughout pregnancy, but they are not always a sign that you are in labor. You may have practice contractions called Braxton Hicks contractions. These false labor contractions are sometimes confused with true labor. What are Deberah PeltonBraxton Hicks contractions? Braxton Hicks contractions are tightening movements that occur in the muscles of the uterus before labor. Unlike true labor contractions, these contractions do not result in opening (dilation) and thinning of the cervix. Toward the end of pregnancy (32-34 weeks), Braxton Hicks contractions can happen more often and may become stronger. These contractions are sometimes difficult to tell apart from true labor because they can be very uncomfortable. You should not feel embarrassed if you go to the hospital with false labor. Sometimes, the only way to tell if you are in true labor is for your health care provider to look for changes in the cervix. The health care provider will do a physical exam and may monitor your contractions. If you are not in true labor, the exam should  show that your cervix is not dilating and your water has not broken. If there are other health problems associated with your pregnancy, it is completely safe for you to be sent home with false labor. You may continue to have Braxton Hicks contractions until you go into true labor. How to tell the difference between true labor and false labor True labor  Contractions last 30-70 seconds.  Contractions become very regular.  Discomfort is usually felt in the top of the uterus, and it spreads to the lower abdomen and low back.  Contractions do not go away with walking.  Contractions usually become more intense and increase in frequency.  The cervix dilates and gets thinner. False labor  Contractions are usually shorter and not as strong as true labor contractions.  Contractions are usually irregular.  Contractions are often felt in the front of the lower abdomen and in the groin.  Contractions may go away when you walk around or change positions while lying down.  Contractions get weaker and are shorter-lasting as time goes on.  The cervix usually does not dilate or become thin. Follow these instructions at home:  Take over-the-counter and prescription medicines only as told by your health care provider.  Keep up with your usual exercises and follow other instructions from your health care provider.  Eat and drink lightly if you think you are going into labor.  If Braxton Hicks contractions are making you uncomfortable: ? Change your position from lying down  or resting to walking, or change from walking to resting. ? Sit and rest in a tub of warm water. ? Drink enough fluid to keep your urine pale yellow. Dehydration may cause these contractions. ? Do slow and deep breathing several times an hour.  Keep all follow-up prenatal visits as told by your health care provider. This is important. Contact a health care provider if:  You have a fever.  You have continuous pain in  your abdomen. Get help right away if:  Your contractions become stronger, more regular, and closer together.  You have fluid leaking or gushing from your vagina.  You pass blood-tinged mucus (bloody show).  You have bleeding from your vagina.  You have low back pain that you never had before.  You feel your baby's head pushing down and causing pelvic pressure.  Your baby is not moving inside you as much as it used to. Summary  Contractions that occur before labor are called Braxton Hicks contractions, false labor, or practice contractions.  Braxton Hicks contractions are usually shorter, weaker, farther apart, and less regular than true labor contractions. True labor contractions usually become progressively stronger and regular and they become more frequent.  Manage discomfort from [facility] contractions by changing position, resting in a warm bath, drinking plenty of water, or practicing deep breathing. This information is not intended to replace advice given to you by your health care provider. Make sure you discuss any questions you have with your health care provider. Document Released: 02/02/2017 Document Revised: 02/02/2017 Document Reviewed: 02/02/2017 Elsevier Interactive Patient Education  2018 [employer].

## 2018-04-20 NOTE — Progress Notes (Signed)
   LOW-RISK PREGNANCY VISIT Patient name: Yvonne Ayala MRN [id number]  Date of birth: 03/11/2001 Chief Complaint:   low risk ob (pressure)  History of Present Illness:   Yvonne Ayala is a 17 y.o. G1P0 female at 452w3d with an Estimated Date of Delivery: 05/08/18 being seen today for ongoing management of a low-risk pregnancy.  Today she reports swelling lower legs. Contractions: Not present.  .  Movement: Present. denies leaking of fluid. Review of Systems:   Pertinent items are noted in HPI Denies abnormal vaginal discharge w/ itching/odor/irritation, headaches, visual changes, shortness of breath, chest pain, abdominal pain, severe nausea/vomiting, or problems with urination or bowel movements unless otherwise stated above. Pertinent History Reviewed:  Reviewed past medical,surgical, social, obstetrical and family history.  Reviewed problem list, medications and allergies. Physical Assessment:   Vitals:   04/20/18 1031  BP: 107/71  Pulse: 99  Weight: 236 lb 12.8 oz (107.4 kg)  Body mass index is 35.48 kg/m.        Physical Examination:   General appearance: Well appearing, and in no distress  Mental status: Alert, oriented to person, place, and time  Skin: Warm & dry  Cardiovascular: Normal heart rate noted  Respiratory: Normal respiratory effort, no distress  Abdomen: Soft, gravid, nontender  Pelvic: Cervical exam performed  Dilation: 1 Effacement (%): Thick Station: -2  Extremities: Edema: Mild pitting, slight indentation  Fetal Status: Fetal Heart Rate (bpm): 138 Fundal Height: 35 cm Movement: Present Presentation: Vertex  Results for orders placed or performed in visit on 04/20/18 (from the past 24 hour(s))  POC Urinalysis Dipstick OB   Collection Time: 04/20/18 10:38 AM  Result Value Ref Range   Color, UA     Clarity, UA     Glucose, UA Negative (A) (none)   Bilirubin, UA     Ketones, UA neg    Spec Grav, UA  1.010 - 1.025   Blood, UA neg    pH, UA  5.0 - 8.0     POC Protein UA Trace Negative, Trace   Urobilinogen, UA  0.2 or 1.0 E.U./dL   Nitrite, UA neg    Leukocytes, UA Negative Negative   Appearance     Odor      Assessment & Plan:  1) Low-risk pregnancy G1P0 at 3752w3d with an Estimated Date of Delivery: 05/08/18   2) Leg edema, elevate above level of heart as much as possible, bp normal, reviewed pre-e s/s   Meds: No orders of the defined types were placed in this encounter.  Labs/procedures today: sve  Plan:  Continue routine obstetrical care   Reviewed: Term labor symptoms and general obstetric precautions including but not limited to vaginal bleeding, contractions, leaking of fluid and fetal movement were reviewed in detail with the patient.  All questions were answered  Follow-up: Return in about 1 week (around 04/27/2018) for LROB.  Orders Placed This Encounter  Procedures  . POC Urinalysis Dipstick OB   Cheral MarkerKimberly R Kenyanna Grzesiak CNM, [facility]NP-BC 04/20/2018 11:00 AM

## 2018-04-27 ENCOUNTER — Ambulatory Visit (INDEPENDENT_AMBULATORY_CARE_PROVIDER_SITE_OTHER): Payer: Medicaid Other | Admitting: Women's Health

## 2018-04-27 ENCOUNTER — Encounter: Payer: Self-pay | Admitting: Women's Health

## 2018-04-27 VITALS — BP 110/71 | HR 86 | Wt 232.5 lb

## 2018-04-27 DIAGNOSIS — Z3403 Encounter for supervision of normal first pregnancy, third trimester: Secondary | ICD-10-CM

## 2018-04-27 DIAGNOSIS — Z3A38 38 weeks gestation of pregnancy: Secondary | ICD-10-CM

## 2018-04-27 DIAGNOSIS — Z1389 Encounter for screening for other disorder: Secondary | ICD-10-CM

## 2018-04-27 DIAGNOSIS — Z331 Pregnant state, incidental: Secondary | ICD-10-CM

## 2018-04-27 LAB — POCT URINALYSIS DIPSTICK OB
GLUCOSE, UA: NEGATIVE — AB
Ketones, UA: NEGATIVE
Nitrite, UA: NEGATIVE
POC,PROTEIN,UA: NEGATIVE

## 2018-04-27 NOTE — Progress Notes (Signed)
   LOW-RISK PREGNANCY VISIT Patient name: Yvonne Ayala MRN [id number]  Date of birth: 07/08/2001 Chief Complaint:   Routine Prenatal Visit  History of Present Illness:   Yvonne Ayala is a 17 y.o. G1P0 female at [redacted]w[redacted]d with an Estimated Date of Delivery: 05/08/18 being seen today for ongoing management of a low-risk pregnancy.  Today she reports no complaints. Contractions: Not present. Vag. Bleeding: None.  Movement: Present. denies leaking of fluid. Review of Systems:   Pertinent items are noted in HPI Denies abnormal vaginal discharge w/ itching/odor/irritation, headaches, visual changes, shortness of breath, chest pain, abdominal pain, severe nausea/vomiting, or problems with urination or bowel movements unless otherwise stated above. Pertinent History Reviewed:  Reviewed past medical,surgical, social, obstetrical and family history.  Reviewed problem list, medications and allergies. Physical Assessment:   Vitals:   04/27/18 1128  BP: 110/71  Pulse: 86  Weight: 232 lb 8 oz (105.5 kg)  Body mass index is 34.84 kg/m.        Physical Examination:   General appearance: Well appearing, and in no distress  Mental status: Alert, oriented to person, place, and time  Skin: Warm & dry  Cardiovascular: Normal heart rate noted  Respiratory: Normal respiratory effort, no distress  Abdomen: Soft, gravid, nontender  Pelvic: Cervical exam performed  Dilation: 1 Effacement (%): Thick Station: -2  Extremities: Edema: None  Fetal Status: Fetal Heart Rate (bpm): 142 Fundal Height: 36 cm Movement: Present Presentation: Vertex  Results for orders placed or performed in visit on 04/27/18 (from the past 24 hour(s))  POC Urinalysis Dipstick OB   Collection Time: 04/27/18 11:30 AM  Result Value Ref Range   Color, UA     Clarity, UA     Glucose, UA Negative (A) (none)   Bilirubin, UA     Ketones, UA neg    Spec Grav, UA  1.010 - 1.025   Blood, UA trace    pH, UA  5.0 - 8.0   POC Protein UA  Negative Negative, Trace   Urobilinogen, UA  0.2 or 1.0 E.U./dL   Nitrite, UA neg    Leukocytes, UA Trace (A) Negative   Appearance     Odor      Assessment & Plan:  1) Low-risk pregnancy G1P0 at 448w3d with an Estimated Date of Delivery: 05/08/18    Meds: No orders of the defined types were placed in this encounter.  Labs/procedures today: sve  Plan:  Continue routine obstetrical care   Reviewed: Term labor symptoms and general obstetric precautions including but not limited to vaginal bleeding, contractions, leaking of fluid and fetal movement were reviewed in detail with the patient.  All questions were answered  Follow-up: Return in about 1 week (around 05/04/2018) for LROB.  Orders Placed This Encounter  Procedures  . POC Urinalysis Dipstick OB   Cheral MarkerKimberly R Jaceon Heiberger CNM, WHNP-BC 04/27/2018 12:00 PM

## 2018-04-27 NOTE — Patient Instructions (Signed)
Yvonne Ayala, I greatly value your feedback.  If you receive a survey following your visit with us today, we appreciate you taking the time to fill it out.  Thanks, Kim Astria Jordahl, CNM, WHNP-BC   Call the office ([phone]) or go to [facility] if:  You begin to have strong, frequent contractions  Your water breaks.  Sometimes it is a big gush of fluid, sometimes it is just a trickle that keeps getting your panties wet or running down your legs  You have vaginal bleeding.  It is normal to have a small amount of spotting if your cervix was checked.   You don't feel your baby moving like normal.  If you don't, get you something to eat and drink and lay down and focus on feeling your baby move.  You should feel at least 10 movements in 2 hours.  If you don't, you should call the office or go to [facility].     Braxton Hicks Contractions Contractions of the uterus can occur throughout pregnancy, but they are not always a sign that you are in labor. You may have practice contractions called Braxton Hicks contractions. These false labor contractions are sometimes confused with true labor. What are Braxton Hicks contractions? Braxton Hicks contractions are tightening movements that occur in the muscles of the uterus before labor. Unlike true labor contractions, these contractions do not result in opening (dilation) and thinning of the cervix. Toward the end of pregnancy (32-34 weeks), Braxton Hicks contractions can happen more often and may become stronger. These contractions are sometimes difficult to tell apart from true labor because they can be very uncomfortable. You should not feel embarrassed if you go to the hospital with false labor. Sometimes, the only way to tell if you are in true labor is for your health care provider to look for changes in the cervix. The health care provider will do a physical exam and may monitor your contractions. If you are not in true labor, the exam should  show that your cervix is not dilating and your water has not broken. If there are other health problems associated with your pregnancy, it is completely safe for you to be sent home with false labor. You may continue to have Braxton Hicks contractions until you go into true labor. How to tell the difference between true labor and false labor True labor  Contractions last 30-70 seconds.  Contractions become very regular.  Discomfort is usually felt in the top of the uterus, and it spreads to the lower abdomen and low back.  Contractions do not go away with walking.  Contractions usually become more intense and increase in frequency.  The cervix dilates and gets thinner. False labor  Contractions are usually shorter and not as strong as true labor contractions.  Contractions are usually irregular.  Contractions are often felt in the front of the lower abdomen and in the groin.  Contractions may go away when you walk around or change positions while lying down.  Contractions get weaker and are shorter-lasting as time goes on.  The cervix usually does not dilate or become thin. Follow these instructions at home:  Take over-the-counter and prescription medicines only as told by your health care provider.  Keep up with your usual exercises and follow other instructions from your health care provider.  Eat and drink lightly if you think you are going into labor.  If Braxton Hicks contractions are making you uncomfortable: ? Change your position from lying down   or resting to walking, or change from walking to resting. ? Sit and rest in a tub of warm water. ? Drink enough fluid to keep your urine pale yellow. Dehydration may cause these contractions. ? Do slow and deep breathing several times an hour.  Keep all follow-up prenatal visits as told by your health care provider. This is important. Contact a health care provider if:  You have a fever.  You have continuous pain in  your abdomen. Get help right away if:  Your contractions become stronger, more regular, and closer together.  You have fluid leaking or gushing from your vagina.  You pass blood-tinged mucus (bloody show).  You have bleeding from your vagina.  You have low back pain that you never had before.  You feel your baby's head pushing down and causing pelvic pressure.  Your baby is not moving inside you as much as it used to. Summary  Contractions that occur before labor are called Braxton Hicks contractions, false labor, or practice contractions.  Braxton Hicks contractions are usually shorter, weaker, farther apart, and less regular than true labor contractions. True labor contractions usually become progressively stronger and regular and they become more frequent.  Manage discomfort from [facility] contractions by changing position, resting in a warm bath, drinking plenty of water, or practicing deep breathing. This information is not intended to replace advice given to you by your health care provider. Make sure you discuss any questions you have with your health care provider. Document Released: 02/02/2017 Document Revised: 02/02/2017 Document Reviewed: 02/02/2017 Elsevier Interactive Patient Education  2018 [employer].

## 2018-05-04 ENCOUNTER — Encounter: Payer: Self-pay | Admitting: Women's Health

## 2018-05-04 ENCOUNTER — Ambulatory Visit (INDEPENDENT_AMBULATORY_CARE_PROVIDER_SITE_OTHER): Payer: Medicaid Other | Admitting: Women's Health

## 2018-05-04 VITALS — BP 122/78 | HR 88 | Wt 234.0 lb

## 2018-05-04 DIAGNOSIS — Z3A39 39 weeks gestation of pregnancy: Secondary | ICD-10-CM

## 2018-05-04 DIAGNOSIS — Z331 Pregnant state, incidental: Secondary | ICD-10-CM

## 2018-05-04 DIAGNOSIS — O480 Post-term pregnancy: Secondary | ICD-10-CM

## 2018-05-04 DIAGNOSIS — Z3403 Encounter for supervision of normal first pregnancy, third trimester: Secondary | ICD-10-CM

## 2018-05-04 DIAGNOSIS — Z1389 Encounter for screening for other disorder: Secondary | ICD-10-CM

## 2018-05-04 LAB — POCT URINALYSIS DIPSTICK OB
Blood, UA: NEGATIVE
Glucose, UA: NEGATIVE — AB
Ketones, UA: NEGATIVE
Leukocytes, UA: NEGATIVE
NITRITE UA: NEGATIVE
PROTEIN: NEGATIVE

## 2018-05-04 NOTE — Progress Notes (Signed)
   LOW-RISK PREGNANCY VISIT Patient name: Yvonne Ayala MRN [id number]  Date of birth: 07/11/2001 Chief Complaint:   Routine Prenatal Visit  History of Present Illness:   Yvonne Ayala is a 17 y.o. G1P0 female at 192w3d with an Estimated Date of Delivery: 05/08/18 being seen today for ongoing management of a low-risk pregnancy.  Today she reports no complaints. Contractions: Irregular. Vag. Bleeding: None.  Movement: Present. denies leaking of fluid. Review of Systems:   Pertinent items are noted in HPI Denies abnormal vaginal discharge w/ itching/odor/irritation, headaches, visual changes, shortness of breath, chest pain, abdominal pain, severe nausea/vomiting, or problems with urination or bowel movements unless otherwise stated above. Pertinent History Reviewed:  Reviewed past medical,surgical, social, obstetrical and family history.  Reviewed problem list, medications and allergies. Physical Assessment:   Vitals:   05/04/18 1055  BP: 122/78  Pulse: 88  Weight: 234 lb (106.1 kg)  Body mass index is 35.06 kg/m.        Physical Examination:   General appearance: Well appearing, and in no distress  Mental status: Alert, oriented to person, place, and time  Skin: Warm & dry  Cardiovascular: Normal heart rate noted  Respiratory: Normal respiratory effort, no distress  Abdomen: Soft, gravid, nontender  Pelvic: Cervical exam deferred         Extremities: Edema: None  Fetal Status: Fetal Heart Rate (bpm): 135 Fundal Height: 38 cm Movement: Present Presentation: Vertex by Leopold's  Results for orders placed or performed in visit on 05/04/18 (from the past 24 hour(s))  POC Urinalysis Dipstick OB   Collection Time: 05/04/18 11:00 AM  Result Value Ref Range   Color, UA     Clarity, UA     Glucose, UA Negative (A) (none)   Bilirubin, UA     Ketones, UA neg    Spec Grav, UA  1.010 - 1.025   Blood, UA neg    pH, UA  5.0 - 8.0   POC Protein UA Negative Negative, Trace   Urobilinogen, UA  0.2 or 1.0 E.U./dL   Nitrite, UA neg    Leukocytes, UA Negative Negative   Appearance     Odor      Assessment & Plan:  1) Low-risk pregnancy G1P0 at 19592w3d with an Estimated Date of Delivery: 05/08/18    Meds: No orders of the defined types were placed in this encounter.  Labs/procedures today: none  Plan:  Continue routine obstetrical care  Reviewed: Term labor symptoms and general obstetric precautions including but not limited to vaginal bleeding, contractions, leaking of fluid and fetal movement were reviewed in detail with the patient.  All questions were answered  Follow-up: Return in about 1 week (around 05/11/2018) for LROB, US:BPP.  Orders Placed This Encounter  Procedures  . US FETAL BPP WO NON STRESS  . POC Urinalysis Dipstick OB   Cheral MarkerKimberly R Jirah Rider CNM, [facility]NP-BC 05/04/2018 11:17 AM

## 2018-05-04 NOTE — Patient Instructions (Signed)
Yvonne KarvonenMakayla Ayala, I greatly value your feedback.  If you receive a survey following your visit with us today, we appreciate you taking the time to fill it out.  Thanks, Yvonne HaffKim Ayala, CNM, WHNP-BC   Call the office [phone]) or [facility] if:  You begin to have strong, frequent contractions  Your water breaks.  Sometimes it is a big gush of fluid, sometimes it is just a trickle that keeps getting your panties wet or running down your legs  You have vaginal bleeding.  It is normal to have a small amount of spotting if your cervix was checked.   You don't feel your baby moving like normal.  If you don't, get you something to eat and drink and lay down and focus on feeling your baby move.  You should feel at least 10 movements in 2 hours.  If you don't, you should call the office or [facility].     [facility] Contractions Contractions of the uterus can occur throughout pregnancy, but they are not always a sign that you are in labor. You may have practice contractions called Braxton Hicks contractions. These false labor contractions are sometimes confused with true labor. What are Yvonne PeltonBraxton Hicks contractions? Braxton Hicks contractions are tightening movements that occur in the muscles of the uterus before labor. Unlike true labor contractions, these contractions do not result in opening (dilation) and thinning of the cervix. Toward the end of pregnancy (32-34 weeks), Braxton Hicks contractions can happen more often and may become stronger. These contractions are sometimes difficult to tell apart from true labor because they can be very uncomfortable. You should not feel embarrassed if you go to the hospital with false labor. Sometimes, the only way to tell if you are in true labor is for your health care provider to look for changes in the cervix. The health care provider will do a physical exam and may monitor your contractions. If you are not in true labor, the exam should  show that your cervix is not dilating and your water has not broken. If there are other health problems associated with your pregnancy, it is completely safe for you to be sent home with false labor. You may continue to have Braxton Hicks contractions until you go into true labor. How to tell the difference between true labor and false labor True labor  Contractions last 30-70 seconds.  Contractions become very regular.  Discomfort is usually felt in the top of the uterus, and it spreads to the lower abdomen and low back.  Contractions do not go away with walking.  Contractions usually become more intense and increase in frequency.  The cervix dilates and gets thinner. False labor  Contractions are usually shorter and not as strong as true labor contractions.  Contractions are usually irregular.  Contractions are often felt in the front of the lower abdomen and in the groin.  Contractions may go away when you walk around or change positions while lying down.  Contractions get weaker and are shorter-lasting as time goes on.  The cervix usually does not dilate or become thin. Follow these instructions at home:  Take over-the-counter and prescription medicines only as told by your health care provider.  Keep up with your usual exercises and follow other instructions from your health care provider.  Eat and drink lightly if you think you are going into labor.  If Braxton Hicks contractions are making you uncomfortable: ? Change your position from lying down  or resting to walking, or change from walking to resting. ? Sit and rest in a tub of warm water. ? Drink enough fluid to keep your urine pale yellow. Dehydration may cause these contractions. ? Do slow and deep breathing several times an hour.  Keep all follow-up prenatal visits as told by your health care provider. This is important. Contact a health care provider if:  You have a fever.  You have continuous pain in  your abdomen. Get help right away if:  Your contractions become stronger, more regular, and closer together.  You have fluid leaking or gushing from your vagina.  You pass blood-tinged mucus (bloody show).  You have bleeding from your vagina.  You have low back pain that you never had before.  You feel your baby's head pushing down and causing pelvic pressure.  Your baby is not moving inside you as much as it used to. Summary  Contractions that occur before labor are called Braxton Hicks contractions, false labor, or practice contractions.  Braxton Hicks contractions are usually shorter, weaker, farther apart, and less regular than true labor contractions. True labor contractions usually become progressively stronger and regular and they become more frequent.  Manage discomfort from [facility] contractions by changing position, resting in a warm bath, drinking plenty of water, or practicing deep breathing. This information is not intended to replace advice given to you by your health care provider. Make sure you discuss any questions you have with your health care provider. Document Released: 02/02/2017 Document Revised: 02/02/2017 Document Reviewed: 02/02/2017 Elsevier Interactive Patient Education  2018 [employer].

## 2018-05-08 ENCOUNTER — Telehealth: Payer: Self-pay | Admitting: Obstetrics & Gynecology

## 2018-05-08 NOTE — Telephone Encounter (Signed)
Pt called stating that she has been having some sharp pains in the pelvic area for the last 2 days. She states that she has tried taking tylenol and it doesn't really help. No c/o contractions, vaginal bleeding, LOF. She states that baby is moving well. Advised that pelvic pain can be normal at [redacted] wks gestation as baby is sitting further down in the pelvic canal. Advised to try a warm bath to see if that would help. Advised to call back if any other problems arise. Pt verbalized understanding.

## 2018-05-10 ENCOUNTER — Encounter (HOSPITAL_COMMUNITY): Payer: Self-pay

## 2018-05-10 ENCOUNTER — Inpatient Hospital Stay (HOSPITAL_BASED_OUTPATIENT_CLINIC_OR_DEPARTMENT_OTHER): Payer: Medicaid Other

## 2018-05-10 ENCOUNTER — Inpatient Hospital Stay (HOSPITAL_COMMUNITY)
Admission: AD | Admit: 2018-05-10 | Discharge: 2018-05-11 | Disposition: A | Discharge status: Home / Self Care | Payer: Medicaid Other | Source: Ambulatory Visit | Attending: Obstetrics and Gynecology | Admitting: Obstetrics and Gynecology

## 2018-05-10 DIAGNOSIS — Z3A40 40 weeks gestation of pregnancy: Secondary | ICD-10-CM | POA: Diagnosis not present

## 2018-05-10 DIAGNOSIS — O471 False labor at or after 37 completed weeks of gestation: Secondary | ICD-10-CM | POA: Diagnosis not present

## 2018-05-10 DIAGNOSIS — O480 Post-term pregnancy: Secondary | ICD-10-CM

## 2018-05-10 DIAGNOSIS — O4290 Premature rupture of membranes, unspecified as to length of time between rupture and onset of labor, unspecified weeks of gestation: Secondary | ICD-10-CM

## 2018-05-10 DIAGNOSIS — O26893 Other specified pregnancy related conditions, third trimester: Secondary | ICD-10-CM | POA: Insufficient documentation

## 2018-05-10 DIAGNOSIS — N898 Other specified noninflammatory disorders of vagina: Secondary | ICD-10-CM | POA: Diagnosis present

## 2018-05-10 DIAGNOSIS — O479 False labor, unspecified: Secondary | ICD-10-CM

## 2018-05-10 LAB — POCT FERN TEST: POCT Fern Test: NEGATIVE

## 2018-05-10 NOTE — MAU Note (Signed)
Pt states she started leaking fluid for several days to the point that she is changing her underwear several times a day and lower abdominal and back pain. Pt states the pain feels like contractions that are every 6--7 mins. Pt denies vaginal bleeding. Reports good fetal movement. States cervix was checked 2 weeks ago and she was 1cm.

## 2018-05-10 NOTE — MAU Note (Signed)
Urine in lab 

## 2018-05-10 NOTE — MAU Provider Note (Signed)
Chief Complaint:  Rupture of Membranes   First Provider Initiated Contact with Patient 05/10/18 2230     HPI: Yvonne Ayala is a 17 y.o. G1P0 at 6340w2dwho presents to maternity admissions reporting leaking fluid from vagina 3 days ago.  States it has been soaking clothes and large pads.  Told RN it was only getting her underwear wet.  Some contractions, not all painful. She reports good fetal movement, denies LOF, vaginal bleeding, vaginal itching/burning, urinary symptoms, h/a, dizziness, n/v, diarrhea, constipation or fever/chills.    Vaginal Discharge  The patient's primary symptoms include pelvic pain and vaginal discharge. The patient's pertinent negatives include no genital itching, genital lesions, genital odor or vaginal bleeding. This is a new problem. The current episode started in the past 7 days. The problem occurs intermittently. The pain is mild. She is pregnant. Associated symptoms include abdominal pain. Pertinent negatives include no chills, constipation, diarrhea, fever, nausea or vomiting. The vaginal discharge was clear and watery. There has been no bleeding. She has not been passing clots. She has not been passing tissue. Nothing aggravates the symptoms. She has tried nothing for the symptoms.   RN Note: Pt states she started leaking fluid for several days to the point that she is changing her underwear several times a day and lower abdominal and back pain. Pt states the pain feels like contractions that are every 6--7 mins. Pt denies vaginal bleeding. Reports good fetal movement. States cervix was checked 2 weeks ago and she was 1cm.   Past Medical History: Past Medical History:  Diagnosis Date  . Asthma    as a child  . MRSA infection    at age 89 and hospitalized    Past obstetric history: OB History  Gravida Para Term Preterm AB Living  1            SAB TAB Ectopic Multiple Live Births               # Outcome Date GA Lbr Len/2nd Weight Sex Delivery Anes PTL Lv   1 Current             Past Surgical History: Past Surgical History:  Procedure Laterality Date  . WISDOM TOOTH EXTRACTION      Family History: Family History  Adopted: Yes    Social History: Social History   Tobacco Use  . Smoking status: Never Smoker  . Smokeless tobacco: Never Used  Substance Use Topics  . Alcohol use: No  . Drug use: No    Allergies:  Allergies  Allergen Reactions  . Vancomycin Itching    Meds:  Medications Prior to Admission  Medication Sig Dispense Refill Last Dose  . prenatal vitamin w/FE, FA (PRENATAL 1 + 1) 27-1 MG TABS tablet Take 1 tablet by mouth daily at 12 noon. 30 each 12 Taking    I have reviewed patient's Past Medical Hx, Surgical Hx, Family Hx, Social Hx, medications and allergies.   ROS:  Review of Systems  Constitutional: Negative for chills and fever.  Gastrointestinal: Positive for abdominal pain. Negative for constipation, diarrhea, nausea and vomiting.  Genitourinary: Positive for pelvic pain and vaginal discharge.   Other systems negative  Physical Exam   Patient Vitals for the past 24 hrs:  BP Temp Temp src Pulse Resp SpO2 Height Weight  05/10/18 2227 112/73 - - 88 - - - -  05/10/18 2056 140/85 98.2 F (36.8 C) Oral 97 18 99 % 5' 8.5" (1.74 m) 112 kg  Constitutional: Well-developed, well-nourished female in no acute distress.  Cardiovascular: normal rate and rhythm Respiratory: normal effort, clear to auscultation bilaterally GI: Abd soft, non-tender, gravid appropriate for gestational age.   No rebound or guarding. MS: Extremities nontender, no edema, normal ROM Neurologic: Alert and oriented x 4.  GU: Neg CVAT.  PELVIC EXAM: Cervix pink, visually closed, without lesion, scant white creamy discharge, vaginal walls and external genitalia normal    No Pooling. Dilation: 1 Effacement (%): Thick Station: -3 Presentation: Vertex Exam by:: Zenia Resides, RN [phone]  FHT:  Baseline 140 , moderate variability,  accelerations present, no decelerations Contractions:  Irregular     Labs: --/--/A POS, A POS Performed at [facility], [address] [place], [place] [address]  [phone]) Results for orders placed or performed during the hospital encounter of 05/10/18 (from the past 24 hour(s))  POCT fern test     Status: None   Collection Time: 05/10/18 11:00 PM  Result Value Ref Range   POCT Fern Test Negative = intact amniotic membranes     Imaging:  AFI 8.55  MAU Course/MDM: I have ordered labs and reviewed results. Crist Fat is negative [place] ordered due to story of large leaking.  AFI normal per consult Dr Earlene Plater.  No rupture evident NST reviewed, reactive Consult Dr Earlene Plater with presentation, exam findings and test results.  Treatments in MAU included EFM.    Assessment: SIUP at [redacted]w[redacted]d Vaginal discharge during pregnancy in third trimester - Plan: [place] MFM OB LIMITED, [place] MFM OB LIMITED, Discharge patient  Uterine contractions during pregnancy - Plan: Discharge patient    Plan: Discharge home Labor precautions and fetal kick counts Follow up in Office for prenatal visits and recheck  Encouraged to return here or to other Urgent Care/ED if she develops worsening of symptoms, increase in pain, fever, or other concerning symptoms.   Pt stable at time of discharge.  Wynelle Bourgeois CNM, MSN Certified Nurse-Midwife 05/10/2018 10:30 PM

## 2018-05-11 ENCOUNTER — Other Ambulatory Visit: Payer: Self-pay | Admitting: Advanced Practice Midwife

## 2018-05-11 ENCOUNTER — Ambulatory Visit (INDEPENDENT_AMBULATORY_CARE_PROVIDER_SITE_OTHER): Payer: Medicaid Other | Admitting: Obstetrics & Gynecology

## 2018-05-11 ENCOUNTER — Encounter: Payer: Self-pay | Admitting: Obstetrics & Gynecology

## 2018-05-11 VITALS — BP 120/81 | HR 88 | Wt 245.0 lb

## 2018-05-11 DIAGNOSIS — O480 Post-term pregnancy: Secondary | ICD-10-CM

## 2018-05-11 DIAGNOSIS — Z331 Pregnant state, incidental: Secondary | ICD-10-CM | POA: Diagnosis not present

## 2018-05-11 DIAGNOSIS — Z3A40 40 weeks gestation of pregnancy: Secondary | ICD-10-CM

## 2018-05-11 DIAGNOSIS — Z1389 Encounter for screening for other disorder: Secondary | ICD-10-CM | POA: Diagnosis not present

## 2018-05-11 DIAGNOSIS — Z3403 Encounter for supervision of normal first pregnancy, third trimester: Secondary | ICD-10-CM

## 2018-05-11 LAB — POCT URINALYSIS DIPSTICK OB
Glucose, UA: NEGATIVE — AB
KETONES UA: NEGATIVE
Leukocytes, UA: NEGATIVE
NITRITE UA: NEGATIVE
PROTEIN: NEGATIVE
RBC UA: NEGATIVE

## 2018-05-11 NOTE — Treatment Plan (Signed)
   Induction Assessment Scheduling Form: [facility] to [facility]:  [phone]  Norva KarvonenMakayla Williams                                                                                   DOB:  08/24/2001                                                            MRN:  [id number]                                                                     Phone #:         [phone]                   Provider:  [facility]  GP:  G1P0                                                            Estimated Date of Delivery: 05/08/18  Dating Criteria: LMP sonogram    Medical Indications for induction:  Post dates Admission Date/Time:  05/15/2018 0730 Gestational age on admission:  4275w0d   Filed Weights   05/11/18 1107  Weight: 245 lb (111.1 kg)   HIV:  Non Reactive (05/03 0846) GBS: Negative (07/12 1200)  2 cm dilated, 0 effaced, -3 station, cervical position posterior, consistency soft, Bishop score 6, presenting part vertex   Method of induction(proposed):  choice   Scheduling Provider Signature:  Lazaro ArmsLuther H Eure, MD                                            Today's Date:  05/11/2018

## 2018-05-11 NOTE — Progress Notes (Addendum)
Patient ID: Yvonne Ayala, female   DOB: 02/24/2001, 17 y.o.   MRN: [id number] G1P0 1690w3d Estimated Date of Delivery: 05/08/18  Blood pressure 120/81, pulse 88, weight 245 lb (111.1 kg), last menstrual period 08/01/2017.   BP weight and urine results all reviewed and noted.  Please refer to the obstetrical flow sheet for the fundal height and fetal heart rate documentation:  Patient reports good fetal movement, denies any bleeding and no rupture of membranes symptoms or regular contractions. Patient is without complaints. All questions were answered.  Orders Placed This Encounter  Procedures  . POC Urinalysis Dipstick OB   Reactive NST, performed for post dates pregnancy  Plan:  Continued routine obstetrical care, membranes swept cx 2/th/-3/soft/post/vertex  Return in about 6 weeks (around 06/22/2018) for post partum visit.

## 2018-05-11 NOTE — Discharge Instructions (Signed)
Vaginal Delivery Vaginal delivery means that you will give birth by pushing your baby out of your birth canal (vagina). A team of health care providers will help you before, during, and after vaginal delivery. Birth experiences are unique for every woman and every pregnancy, and birth experiences vary depending on where you choose to give birth. What should I do to prepare for my baby's birth? Before your baby is born, it is important to talk with your health care provider about:  Your labor and delivery preferences. These may include: ? Medicines that you may be given. ? How you will manage your pain. This might include non-medical pain relief techniques or injectable pain relief such as epidural analgesia. ? How you and your baby will be monitored during labor and delivery. ? Who may be in the labor and delivery room with you. ? Your feelings about surgical delivery of your baby (cesarean delivery, or C-section) if this becomes necessary. ? Your feelings about receiving donated blood through an IV tube (blood transfusion) if this becomes necessary.  Whether you are able: ? To take pictures or videos of the birth. ? To eat during labor and delivery. ? To move around, walk, or change positions during labor and delivery.  What to expect after your baby is born, such as: ? Whether delayed umbilical cord clamping and cutting is offered. ? Who will care for your baby right after birth. ? Medicines or tests that may be recommended for your baby. ? Whether breastfeeding is supported in your hospital or birth center. ? How long you will be in the hospital or birth center.  How any medical conditions you have may affect your baby or your labor and delivery experience.  To prepare for your baby's birth, you should also:  Attend all of your health care visits before delivery (prenatal visits) as recommended by your health care provider. This is important.  Prepare your home for your baby's  arrival. Make sure that you have: ? Diapers. ? Baby clothing. ? Feeding equipment. ? Safe sleeping arrangements for you and your baby.  Install a car seat in your vehicle. Have your car seat checked by a certified car seat installer to make sure that it is installed safely.  Think about who will help you with your new baby at home for at least the first several weeks after delivery.  What can I expect when I arrive at the birth center or hospital? Once you are in labor and have been admitted into the hospital or birth center, your health care provider may:  Review your pregnancy history and any concerns you have.  Insert an IV tube into one of your veins. This is used to give you fluids and medicines.  Check your blood pressure, pulse, temperature, and heart rate (vital signs).  Check whether your bag of water (amniotic sac) has broken (ruptured).  Talk with you about your birth plan and discuss pain control options.  Monitoring Your health care provider may monitor your contractions (uterine monitoring) and your baby's heart rate (fetal monitoring). You may need to be monitored:  Often, but not continuously (intermittently).  All the time or for long periods at a time (continuously). Continuous monitoring may be needed if: ? You are taking certain medicines, such as medicine to relieve pain or make your contractions stronger. ? You have pregnancy or labor complications.  Monitoring may be done by:  Placing a special stethoscope or a handheld monitoring device on your abdomen to   check your baby's heartbeat, and feeling your abdomen for contractions. This method of monitoring does not continuously record your baby's heartbeat or your contractions.  Placing monitors on your abdomen (external monitors) to record your baby's heartbeat and the frequency and length of contractions. You may not have to wear external monitors all the time.  Placing monitors inside of your uterus  (internal monitors) to record your baby's heartbeat and the frequency, length, and strength of your contractions. ? Your health care provider may use internal monitors if he or she needs more information about the strength of your contractions or your baby's heart rate. ? Internal monitors are put in place by passing a thin, flexible wire through your vagina and into your uterus. Depending on the type of monitor, it may remain in your uterus or on your baby's head until birth. ? Your health care provider will discuss the benefits and risks of internal monitoring with you and will ask for your permission before inserting the monitors.  Telemetry. This is a type of continuous monitoring that can be done with external or internal monitors. Instead of having to stay in bed, you are able to move around during telemetry. Ask your health care provider if telemetry is an option for you.  Physical exam Your health care provider may perform a physical exam. This may include:  Checking whether your baby is positioned: ? With the head toward your vagina (head-down). This is most common. ? With the head toward the top of your uterus (head-up or breech). If your baby is in a breech position, your health care provider may try to turn your baby to a head-down position so you can deliver vaginally. If it does not seem that your baby can be born vaginally, your provider may recommend surgery to deliver your baby. In rare cases, you may be able to deliver vaginally if your baby is head-up (breech delivery). ? Lying sideways (transverse). Babies that are lying sideways cannot be delivered vaginally.  Checking your cervix to determine: ? Whether it is thinning out (effacing). ? Whether it is opening up (dilating). ? How low your baby has moved into your birth canal.  What are the three stages of labor and delivery?  Normal labor and delivery is divided into the following three stages: Stage 1  Stage 1 is the  longest stage of labor, and it can last for hours or days. Stage 1 includes: ? Early labor. This is when contractions may be irregular, or regular and mild. Generally, early labor contractions are more than 10 minutes apart. ? Active labor. This is when contractions get longer, more regular, more frequent, and more intense. ? The transition phase. This is when contractions happen very close together, are very intense, and may last longer than during any other part of labor.  Contractions generally feel mild, infrequent, and irregular at first. They get stronger, more frequent (about every 2-3 minutes), and more regular as you progress from early labor through active labor and transition.  Many women progress through stage 1 naturally, but you may need help to continue making progress. If this happens, your health care provider may talk with you about: ? Rupturing your amniotic sac if it has not ruptured yet. ? Giving you medicine to help make your contractions stronger and more frequent.  Stage 1 ends when your cervix is completely dilated to 4 inches (10 cm) and completely effaced. This happens at the end of the transition phase. Stage 2  Once   your cervix is completely effaced and dilated to 4 inches (10 cm), you may start to feel an urge to push. It is common for the body to naturally take a rest before feeling the urge to push, especially if you received an epidural or certain other pain medicines. This rest period may last for up to 1-2 hours, depending on your unique labor experience.  During stage 2, contractions are generally less painful, because pushing helps relieve contraction pain. Instead of contraction pain, you may feel stretching and burning pain, especially when the widest part of your baby's head passes through the vaginal opening (crowning).  Your health care provider will closely monitor your pushing progress and your baby's progress through the vagina during stage 2.  Your  health care provider may massage the area of skin between your vaginal opening and anus (perineum) or apply warm compresses to your perineum. This helps it stretch as the baby's head starts to crown, which can help prevent perineal tearing. ? In some cases, an incision may be made in your perineum (episiotomy) to allow the baby to pass through the vaginal opening. An episiotomy helps to make the opening of the vagina larger to allow more room for the baby to fit through.  It is very important to breathe and focus so your health care provider can control the delivery of your baby's head. Your health care provider may have you decrease the intensity of your pushing, to help prevent perineal tearing.  After delivery of your baby's head, the shoulders and the rest of the body generally deliver very quickly and without difficulty.  Once your baby is delivered, the umbilical cord may be cut right away, or this may be delayed for 1-2 minutes, depending on your baby's health. This may vary among health care providers, hospitals, and birth centers.  If you and your baby are healthy enough, your baby may be placed on your chest or abdomen to help maintain the baby's temperature and to help you bond with each other. Some mothers and babies start breastfeeding at this time. Your health care team will dry your baby and help keep your baby warm during this time.  Your baby may need immediate care if he or she: ? Showed signs of distress during labor. ? Has a medical condition. ? Was born too early (prematurely). ? Had a bowel movement before birth (meconium). ? Shows signs of difficulty transitioning from being inside the uterus to being outside of the uterus. If you are planning to breastfeed, your health care team will help you begin a feeding. Stage 3  The third stage of labor starts immediately after the birth of your baby and ends after you deliver the placenta. The placenta is an organ that develops  during pregnancy to provide oxygen and nutrients to your baby in the womb.  Delivering the placenta may require some pushing, and you may have mild contractions. Breastfeeding can stimulate contractions to help you deliver the placenta.  After the placenta is delivered, your uterus should tighten (contract) and become firm. This helps to stop bleeding in your uterus. To help your uterus contract and to control bleeding, your health care provider may: ? Give you medicine by injection, through an IV tube, by mouth, or through your rectum (rectally). ? Massage your abdomen or perform a vaginal exam to remove any blood clots that are left in your uterus. ? Empty your bladder by placing a thin, flexible tube (catheter) into your bladder. ? Encourage   you to breastfeed your baby. After labor is over, you and your baby will be monitored closely to ensure that you are both healthy until you are ready to go home. Your health care team will teach you how to care for yourself and your baby. This information is not intended to replace advice given to you by your health care provider. Make sure you discuss any questions you have with your health care provider. Document Released: 06/28/2008 Document Revised: 04/08/2016 Document Reviewed: 10/04/2015 Elsevier Interactive Patient Education  2018 [employer]  

## 2018-05-13 ENCOUNTER — Encounter (HOSPITAL_COMMUNITY): Payer: Self-pay | Admitting: *Deleted

## 2018-05-13 ENCOUNTER — Other Ambulatory Visit: Payer: Self-pay

## 2018-05-13 ENCOUNTER — Inpatient Hospital Stay (HOSPITAL_COMMUNITY)
Admission: AD | Admit: 2018-05-13 | Discharge: 2018-05-13 | Disposition: A | Discharge status: Home / Self Care | Payer: Medicaid Other | Source: Ambulatory Visit | Attending: Obstetrics and Gynecology | Admitting: Obstetrics and Gynecology

## 2018-05-13 ENCOUNTER — Encounter (HOSPITAL_COMMUNITY): Payer: Self-pay

## 2018-05-13 ENCOUNTER — Inpatient Hospital Stay (HOSPITAL_COMMUNITY): Payer: Medicaid Other | Admitting: Anesthesiology

## 2018-05-13 ENCOUNTER — Inpatient Hospital Stay (HOSPITAL_COMMUNITY)
Admission: AD | Admit: 2018-05-13 | Discharge: 2018-05-15 | DRG: 807 | Disposition: A | Discharge status: Home / Self Care | Payer: Medicaid Other | Attending: Family Medicine | Admitting: Family Medicine

## 2018-05-13 DIAGNOSIS — Z3483 Encounter for supervision of other normal pregnancy, third trimester: Secondary | ICD-10-CM | POA: Diagnosis present

## 2018-05-13 DIAGNOSIS — Z3A40 40 weeks gestation of pregnancy: Secondary | ICD-10-CM

## 2018-05-13 DIAGNOSIS — Z8614 Personal history of Methicillin resistant Staphylococcus aureus infection: Secondary | ICD-10-CM | POA: Diagnosis not present

## 2018-05-13 DIAGNOSIS — O26893 Other specified pregnancy related conditions, third trimester: Secondary | ICD-10-CM | POA: Diagnosis present

## 2018-05-13 DIAGNOSIS — O479 False labor, unspecified: Secondary | ICD-10-CM

## 2018-05-13 DIAGNOSIS — Z3403 Encounter for supervision of normal first pregnancy, third trimester: Secondary | ICD-10-CM

## 2018-05-13 DIAGNOSIS — R109 Unspecified abdominal pain: Secondary | ICD-10-CM

## 2018-05-13 LAB — CBC
HEMATOCRIT: 39 % (ref 36.0–46.0)
HEMOGLOBIN: 13.3 g/dL (ref 12.0–15.0)
MCH: 30.2 pg (ref 26.0–34.0)
MCHC: 34.1 g/dL (ref 30.0–36.0)
MCV: 88.4 fL (ref 78.0–100.0)
Platelets: 182 10*3/uL (ref 150–400)
RBC: 4.41 MIL/uL (ref 3.87–5.11)
RDW: 14.4 % (ref 11.5–15.5)
WBC: 16.3 10*3/uL — ABNORMAL HIGH (ref 4.0–10.5)

## 2018-05-13 LAB — TYPE AND SCREEN
ABO/RH(D): A POS
ANTIBODY SCREEN: NEGATIVE

## 2018-05-13 MED ORDER — LACTATED RINGERS IV SOLN: 500.0000 mL | INTRAVENOUS | Status: DC | PRN

## 2018-05-13 MED ORDER — OXYTOCIN 10 UNIT/ML IJ SOLN: 10.0000 [IU] | Freq: Once | INTRAMUSCULAR | Status: DC

## 2018-05-13 MED ORDER — EPHEDRINE 5 MG/ML INJ
10.0000 mg | INTRAVENOUS | Status: DC | PRN
  Filled 2018-05-13: qty 2

## 2018-05-13 MED ORDER — OXYCODONE-ACETAMINOPHEN 5-325 MG PO TABS: 2.0000 | ORAL_TABLET | ORAL | Status: DC | PRN

## 2018-05-13 MED ORDER — FENTANYL 2.5 MCG/ML BUPIVACAINE 1/10 % EPIDURAL INFUSION (WH - ANES)
14.0000 mL/h | INTRAMUSCULAR | Status: DC | PRN
  Administered 2018-05-13: 14 mL/h via EPIDURAL
  Filled 2018-05-13: qty 100

## 2018-05-13 MED ORDER — HYDROXYZINE HCL 50 MG PO TABS
50.0000 mg | ORAL_TABLET | Freq: Four times a day (QID) | ORAL | Status: DC | PRN
Start: 2018-05-13 — End: 2018-05-14
  Filled 2018-05-13: qty 1

## 2018-05-13 MED ORDER — PHENYLEPHRINE 40 MCG/ML (10ML) SYRINGE FOR IV PUSH (FOR BLOOD PRESSURE SUPPORT)
80.0000 ug | PREFILLED_SYRINGE | INTRAVENOUS | Status: DC | PRN
  Filled 2018-05-13: qty 10
  Filled 2018-05-13: qty 5

## 2018-05-13 MED ORDER — PHENYLEPHRINE 40 MCG/ML (10ML) SYRINGE FOR IV PUSH (FOR BLOOD PRESSURE SUPPORT)
80.0000 ug | PREFILLED_SYRINGE | INTRAVENOUS | Status: DC | PRN
  Filled 2018-05-13: qty 5

## 2018-05-13 MED ORDER — OXYTOCIN BOLUS FROM INFUSION
500.0000 mL | Freq: Once | INTRAVENOUS | Status: AC
  Administered 2018-05-14: 500 mL via INTRAVENOUS

## 2018-05-13 MED ORDER — SOD CITRATE-CITRIC ACID 500-334 MG/5ML PO SOLN: 30.0000 mL | ORAL | Status: DC | PRN

## 2018-05-13 MED ORDER — ACETAMINOPHEN 325 MG PO TABS: 650.0000 mg | ORAL_TABLET | ORAL | Status: DC | PRN

## 2018-05-13 MED ORDER — LACTATED RINGERS IV SOLN: 500.0000 mL | Freq: Once | INTRAVENOUS | Status: AC

## 2018-05-13 MED ORDER — DIPHENHYDRAMINE HCL 50 MG/ML IJ SOLN: 12.5000 mg | INTRAMUSCULAR | Status: DC | PRN

## 2018-05-13 MED ORDER — LIDOCAINE HCL (PF) 1 % IJ SOLN
30.0000 mL | INTRAMUSCULAR | Status: DC | PRN
  Filled 2018-05-13: qty 30

## 2018-05-13 MED ORDER — FENTANYL CITRATE (PF) 100 MCG/2ML IJ SOLN
50.0000 ug | INTRAMUSCULAR | Status: DC | PRN
  Administered 2018-05-13 (×2): 100 ug via INTRAVENOUS
  Filled 2018-05-13 (×2): qty 2

## 2018-05-13 MED ORDER — LACTATED RINGERS IV SOLN
500.0000 mL | Freq: Once | INTRAVENOUS | Status: AC
  Administered 2018-05-13: 500 mL via INTRAVENOUS

## 2018-05-13 MED ORDER — LIDOCAINE HCL (PF) 1 % IJ SOLN
INTRAMUSCULAR | Status: DC | PRN
  Administered 2018-05-13 (×2): 4 mL via EPIDURAL

## 2018-05-13 MED ORDER — ONDANSETRON HCL 4 MG/2ML IJ SOLN: 4.0000 mg | Freq: Four times a day (QID) | INTRAMUSCULAR | Status: DC | PRN

## 2018-05-13 MED ORDER — LACTATED RINGERS IV SOLN
INTRAVENOUS | Status: DC
  Administered 2018-05-13 (×2): via INTRAVENOUS

## 2018-05-13 MED ORDER — OXYCODONE-ACETAMINOPHEN 5-325 MG PO TABS: 1.0000 | ORAL_TABLET | ORAL | Status: DC | PRN

## 2018-05-13 MED ORDER — OXYTOCIN 40 UNITS IN LACTATED RINGERS INFUSION - SIMPLE MED
2.5000 [IU]/h | INTRAVENOUS | Status: DC
  Administered 2018-05-14: 2.5 [IU]/h via INTRAVENOUS
  Filled 2018-05-13: qty 1000

## 2018-05-13 NOTE — Anesthesia Procedure Notes (Addendum)
Epidural Patient location during procedure: OB Start time: 05/13/2018 9:40 PM End time: 05/13/2018 9:49 PM  Staffing Anesthesiologist: Mal AmabileFoster, Kynzley Dowson, MD Performed: anesthesiologist   Preanesthetic Checklist Completed: patient identified, site marked, surgical consent, pre-op evaluation, timeout performed, IV checked, risks and benefits discussed and monitors and equipment checked  Epidural Patient position: sitting Prep: site prepped and draped and DuraPrep Patient monitoring: continuous pulse ox and blood pressure Approach: midline Location: L3-L4 Injection technique: LOR air  Needle:  Needle type: Tuohy  Needle gauge: 17 G Needle length: 9 cm and 9 Needle insertion depth: 6 cm Catheter type: closed end flexible Catheter size: 19 Gauge Catheter at skin depth: 11 cm Test dose: negative and Other  Assessment Events: blood not aspirated, injection not painful, no injection resistance, negative IV test and no paresthesia  Additional Notes Patient identified. Risks and benefits discussed including failed block, incomplete  Pain control, post dural puncture headache, nerve damage, paralysis, blood pressure Changes, nausea, vomiting, reactions to medications-both toxic and allergic and post Partum back pain. All questions were answered. Patient expressed understanding and wished to proceed. Sterile technique was used throughout procedure. Epidural site was Dressed with sterile barrier dressing. No paresthesias, signs of intravascular injection Or signs of intrathecal spread were encountered.  Patient was more comfortable after the epidural was dosed. Please see RN's note for documentation of vital signs and FHR which are stable.

## 2018-05-13 NOTE — Progress Notes (Signed)
Yvonne Ayala is a 17 y.o. G1P0 at 786w5d by  ultrasound admitted for active labor  Subjective: S/p epidural, denies pain. Supportive family members at bedside  Objective: BP 115/81   Pulse (!) 111   Temp 97.9 F (36.6 C) (Oral)   Resp 18   LMP 08/01/2017   SpO2 98%  No intake/output data recorded. No intake/output data recorded.  FHT:  FHR: 145 bpm, variability: moderate,  accelerations:  Present,  decelerations:  Absent UC:   regular, every 2-3 minutes SVE:   Dilation: (P) 8 Effacement (%): (P) 100 Station: (P) 0 Exam by:: (P) Yvonne Ayala, CNM  Labs: Lab Results  Component Value Date   WBC 16.3 (H) 05/13/2018   HGB 13.3 05/13/2018   HCT 39.0 05/13/2018   MCV 88.4 05/13/2018   PLT 182 05/13/2018    Assessment / Plan: Spontaneous labor, progressing normally  AROM now: moderate, clear fluid  Labor: Progressing normally Preeclampsia:  N/A Fetal Wellbeing:  Category I Pain Control:  Epidural I/D:  n/a Anticipated MOD:  NSVD, expectant management  Yvonne Ayala, CNM 05/13/2018, 11:17 PM

## 2018-05-13 NOTE — Anesthesia Preprocedure Evaluation (Signed)
Anesthesia Evaluation  Patient identified by MRN, date of birth, ID band Patient awake    Reviewed: Allergy & Precautions, Patient's Chart, lab work & pertinent test results  Airway Mallampati: II  TM Distance: >3 FB Neck ROM: Full    Dental no notable dental hx. (+) Teeth Intact   Pulmonary asthma ,    Pulmonary exam normal breath sounds clear to auscultation       Cardiovascular Normal cardiovascular exam Rhythm:Regular Rate:Normal     Neuro/Psych PSYCHIATRIC DISORDERS negative neurological ROS     GI/Hepatic   Endo/Other    Renal/GU   negative genitourinary   Musculoskeletal negative musculoskeletal ROS (+)   Abdominal (+) + obese,   Peds  Hematology   Anesthesia Other Findings   Reproductive/Obstetrics (+) Pregnancy                             Anesthesia Physical Anesthesia Plan  ASA: II  Anesthesia Plan: Epidural   Post-op Pain Management:    Induction:   PONV Risk Score and Plan:   Airway Management Planned: Natural Airway  Additional Equipment:   Intra-op Plan:   Post-operative Plan:   Informed Consent: I have reviewed the patients History and Physical, chart, labs and discussed the procedure including the risks, benefits and alternatives for the proposed anesthesia with the patient or authorized representative who has indicated his/her understanding and acceptance.     Plan Discussed with: Anesthesiologist  Anesthesia Plan Comments:         Anesthesia Quick Evaluation

## 2018-05-13 NOTE — MAU Note (Signed)
I have communicated with Dr. Truddie Cocoell  and reviewed vital signs:  Vitals:   05/13/18 1208 05/13/18 1320  BP: 116/79 125/85  Pulse: 100 97  Resp:  18  Temp: 98.4 F (36.9 C)   SpO2: 99%     Vaginal exam:  Dilation: 2.5 Effacement (%): 60, 70 Cervical Position: Middle Station: -3 Presentation: Undeterminable Exam by:: Marvel PlanJessica Kiandria Clum RN ,   Also reviewed contraction pattern and that non-stress test is reactive.  It has been documented that patient is contracting every 3-4 minutes with no cervical change over 1 hour not indicating active labor.  Patient denies any other complaints.  Based on this report provider has given order for discharge.  A discharge order and diagnosis entered by a provider.   Labor discharge instructions reviewed with patient.

## 2018-05-13 NOTE — H&P (Signed)
Yvonne KarvonenMakayla Ayala is a 17 y.o. female G1 at 40 weeks and 5 days in with regular contractions since early this morning.  She denies vaginal bleeding or rupture membranes.  GBS is negative. OB History    Gravida  1   Para      Term      Preterm      AB      Living        SAB      TAB      Ectopic      Multiple      Live Births             Past Medical History:  Diagnosis Date  . Asthma    as a child  . MRSA infection    at age 89 and hospitalized   Past Surgical History:  Procedure Laterality Date  . WISDOM TOOTH EXTRACTION     Family History: family history is not on file. She was adopted. Social History:  reports that she has never smoked. She has never used smokeless tobacco. She reports that she does not drink alcohol or use drugs.     Maternal Diabetes: No Genetic Screening: Normal Maternal Ultrasounds/Referrals: Normal Fetal Ultrasounds or other Referrals:  None Maternal Substance Abuse:  No Significant Maternal Medications:  None Significant Maternal Lab Results:  None Other Comments:  None  Review of Systems  Constitutional: Negative.   HENT: Negative.   Eyes: Negative.   Respiratory: Negative.   Cardiovascular: Negative.   Gastrointestinal: Positive for abdominal pain.  Genitourinary: Negative.   Musculoskeletal: Negative.   Skin: Negative.   Neurological: Negative.   Endo/Heme/Allergies: Negative.   Psychiatric/Behavioral: Negative.    Maternal Medical History:  Reason for admission: Contractions.   Contractions: Onset was 6-12 hours ago.   Frequency: irregular.    Fetal activity: Perceived fetal activity is normal.   Last perceived fetal movement was within the past hour.    Prenatal complications: no prenatal complications Prenatal Complications - Diabetes: none.    Dilation: 4.5 Effacement (%): 100 Station: -1 Exam by:: B Mosca RN Blood pressure (!) 146/85, pulse 98, temperature 98 F (36.7 C), temperature source Oral,  last menstrual period 08/01/2017, SpO2 97 %. Maternal Exam:  Uterine Assessment: Contraction strength is moderate.  Contraction frequency is regular.   Abdomen: Patient reports no abdominal tenderness. Fetal presentation: vertex  Introitus: Normal vulva. Normal vagina.  Ferning test: not done.  Nitrazine test: not done. Amniotic fluid character: not assessed.  Pelvis: adequate for delivery.   Cervix: Cervix evaluated by digital exam.     Fetal Exam Fetal Monitor Review: Mode: ultrasound.   Baseline rate: 150.  Variability: moderate (6-25 bpm).   Pattern: accelerations present and no decelerations.       Physical Exam  Constitutional: She is oriented to person, place, and time. She appears well-developed and well-nourished.  HENT:  Head: Normocephalic.  Eyes: Pupils are equal, round, and reactive to light.  Neck: Normal range of motion.  Cardiovascular: Normal rate, regular rhythm, normal heart sounds and intact distal pulses.  Respiratory: Effort normal and breath sounds normal.  GI: Bowel sounds are normal.  Genitourinary: Vagina normal and uterus normal.  Musculoskeletal: Normal range of motion.  Neurological: She is alert and oriented to person, place, and time. She has normal reflexes.  Skin: Skin is warm and dry.  Psychiatric: She has a normal mood and affect. Her behavior is normal. Judgment and thought content normal.    Prenatal  labs: ABO, Rh: --/--/A POS, A POS Performed at [facility], [address] [place], [place] [address]  [phone]) Antibody: NEG (06/18 1924) Rubella: 6.78 (01/14 1505) RPR: Non Reactive (05/03 0846)  HBsAg: Negative (01/14 1505)  HIV: Non Reactive (05/03 0846)  GBS: Negative (07/12 1200)   Assessment/Plan: SVE 5/90/-1 GBS neg Admit active labor Breast feed POPs   Wyvonnia Dusky 05/13/2018, 7:33 PM

## 2018-05-13 NOTE — MAU Note (Signed)
Pt. Reports ctx and pain in her back since last pm at about 8:30pm Pt. Denies leaking fluid, but reports some bloody show after having her membranes swept in office.

## 2018-05-13 NOTE — MAU Note (Signed)
Urine in lab 

## 2018-05-13 NOTE — MAU Note (Signed)
Pt presents to MAU with urge to push.

## 2018-05-13 NOTE — Progress Notes (Signed)
Yvonne KarvonenMakayla Ayala is a [age] y.o. G1P0 at 5431w5d by ultrasound admitted for active labor  Subjective: No complaints or concerns. Very jovial and talkative. Feeling excited about baby, family supportive at bedside. Considering epidural or IV fentanyl for comfort management.  Objective: BP 121/67   Pulse 91   Temp 97.8 F (36.6 C) (Oral)   Resp 16   LMP 08/01/2017   SpO2 97%  No intake/output data recorded. No intake/output data recorded.  FHT:  FHR: 140 bpm, variability: moderate,  accelerations:  Present,  decelerations:  Absent UC:   irregular, every 3-4 minutes SVE:   Dilation: 4.5 Effacement (%): 100 Station: -1 Exam by:: B Mosca RN  Labs: Lab Results  Component Value Date   WBC 16.3 (H) 05/13/2018   HGB 13.3 05/13/2018   HCT 39.0 05/13/2018   MCV 88.4 05/13/2018   PLT 182 05/13/2018    Assessment / Plan: Spontaneous labor, progressing normally  Labor: Progressing normally, reassess need for Pitocin augmentation around 10pm Preeclampsia:  N/A Fetal Wellbeing:  Category I Pain Control:  Labor support without medications I/D:  n/a Anticipated MOD:  NSVD  Calvert CantorSamantha C Weinhold, CNM 05/13/2018, 8:02 PM

## 2018-05-14 ENCOUNTER — Encounter (HOSPITAL_COMMUNITY): Payer: Self-pay

## 2018-05-14 DIAGNOSIS — Z3A40 40 weeks gestation of pregnancy: Secondary | ICD-10-CM

## 2018-05-14 MED ORDER — DIBUCAINE 1 % RE OINT: 1.0000 "application " | TOPICAL_OINTMENT | RECTAL | Status: DC | PRN

## 2018-05-14 MED ORDER — ONDANSETRON HCL 4 MG/2ML IJ SOLN: 4.0000 mg | INTRAMUSCULAR | Status: DC | PRN

## 2018-05-14 MED ORDER — DIPHENHYDRAMINE HCL 25 MG PO CAPS: 25.0000 mg | ORAL_CAPSULE | Freq: Four times a day (QID) | ORAL | Status: DC | PRN

## 2018-05-14 MED ORDER — PRENATAL MULTIVITAMIN CH
1.0000 | ORAL_TABLET | Freq: Every day | ORAL | Status: DC
  Administered 2018-05-14 – 2018-05-15 (×2): 1 via ORAL
  Filled 2018-05-14 (×2): qty 1

## 2018-05-14 MED ORDER — IBUPROFEN 600 MG PO TABS
600.0000 mg | ORAL_TABLET | Freq: Four times a day (QID) | ORAL | Status: DC
  Administered 2018-05-14 – 2018-05-15 (×6): 600 mg via ORAL
  Filled 2018-05-14 (×6): qty 1

## 2018-05-14 MED ORDER — ZOLPIDEM TARTRATE 5 MG PO TABS: 5.0000 mg | ORAL_TABLET | Freq: Every evening | ORAL | Status: DC | PRN

## 2018-05-14 MED ORDER — MAGNESIUM HYDROXIDE 400 MG/5ML PO SUSP: 30.0000 mL | ORAL | Status: DC | PRN

## 2018-05-14 MED ORDER — WITCH HAZEL-GLYCERIN EX PADS: 1.0000 "application " | MEDICATED_PAD | CUTANEOUS | Status: DC | PRN

## 2018-05-14 MED ORDER — OXYCODONE-ACETAMINOPHEN 5-325 MG PO TABS: 2.0000 | ORAL_TABLET | ORAL | Status: DC | PRN

## 2018-05-14 MED ORDER — ACETAMINOPHEN 325 MG PO TABS: 650.0000 mg | ORAL_TABLET | ORAL | Status: DC | PRN

## 2018-05-14 MED ORDER — MEASLES, MUMPS & RUBELLA VAC ~~LOC~~ INJ
0.5000 mL | INJECTION | Freq: Once | SUBCUTANEOUS | Status: DC
  Filled 2018-05-14: qty 0.5

## 2018-05-14 MED ORDER — FERROUS SULFATE 325 (65 FE) MG PO TABS
325.0000 mg | ORAL_TABLET | Freq: Two times a day (BID) | ORAL | Status: DC
  Administered 2018-05-14 – 2018-05-15 (×3): 325 mg via ORAL
  Filled 2018-05-14 (×3): qty 1

## 2018-05-14 MED ORDER — COCONUT OIL OIL: 1.0000 "application " | TOPICAL_OIL | Status: DC | PRN

## 2018-05-14 MED ORDER — OXYCODONE-ACETAMINOPHEN 5-325 MG PO TABS: 1.0000 | ORAL_TABLET | ORAL | Status: DC | PRN

## 2018-05-14 MED ORDER — TETANUS-DIPHTH-ACELL PERTUSSIS 5-2.5-18.5 LF-MCG/0.5 IM SUSP: 0.5000 mL | Freq: Once | INTRAMUSCULAR | Status: DC

## 2018-05-14 MED ORDER — BENZOCAINE-MENTHOL 20-0.5 % EX AERO
1.0000 "application " | INHALATION_SPRAY | CUTANEOUS | Status: DC | PRN
  Filled 2018-05-14: qty 56

## 2018-05-14 MED ORDER — ONDANSETRON HCL 4 MG PO TABS: 4.0000 mg | ORAL_TABLET | ORAL | Status: DC | PRN

## 2018-05-14 MED ORDER — SIMETHICONE 80 MG PO CHEW: 80.0000 mg | CHEWABLE_TABLET | ORAL | Status: DC | PRN

## 2018-05-14 NOTE — Lactation Note (Addendum)
This note was copied from a baby's chart. Lactation Consultation Note  Patient Name: Yvonne Ayala ZOXWR'UToday's Date: 05/14/2018 Reason for consult: Initial assessment   P1, Baby 9 hours old.   Reviewed hand expression with drops expressed.  Demonstrated how to use hand pump to prepump prior to latching. FOB unwrapped baby for feeding.  Baby latched briefly in football hold while STS.  Encouraged mother to support breast and head during feeding. Mom encouraged to feed baby 8-12 times/24 hours and with feeding cues.  Discussed basics. Mom made aware of O/P services, breastfeeding support groups, community resources, and our phone # for post-discharge questions.     Maternal Data Has patient been taught Hand Expression?: Yes Does the patient have breastfeeding experience prior to this delivery?: No  Feeding Feeding Type: Breast Fed Length of feed: 5 min(off and on )  LATCH Score Latch: Grasps breast easily, tongue down, lips flanged, rhythmical sucking.  Audible Swallowing: A few with stimulation  Type of Nipple: Everted at rest and after stimulation  Comfort (Breast/Nipple): Soft / non-tender  Hold (Positioning): Assistance needed to correctly position infant at breast and maintain latch.  LATCH Score: 8  Interventions Interventions: Breast feeding basics reviewed;Assisted with latch;Skin to skin;Breast massage;Hand express;Pre-pump if needed;Breast compression;Support pillows;Adjust position;Hand pump  Lactation Tools Discussed/Used     Consult Status Consult Status: Follow-up Date: 05/15/18 Follow-up type: In-patient    Dahlia ByesBerkelhammer, [facility] 05/14/2018, 11:53 AM

## 2018-05-14 NOTE — Progress Notes (Signed)
CSW received consult for "teen pregnancy," however age for automatic consult to social work is 14 and MOB is 8919.  Please re-consult if concerns arise or by MOB's request.

## 2018-05-14 NOTE — Anesthesia Postprocedure Evaluation (Signed)
Anesthesia Post Note  Patient: Yvonne Ayala  Procedure(s) Performed: AN AD HOC LABOR EPIDURAL  Patient location during evaluation: Mother Baby Anesthesia Type: Epidural Level of consciousness: awake and alert and patient cooperative Pain management: satisfactory to patient Vital Signs Assessment: post-procedure vital signs reviewed and stable Respiratory status: spontaneous breathing Cardiovascular status: stable Postop Assessment: able to ambulate Anesthetic complications: no     Last Vitals:  Vitals:   05/14/18 0853 05/14/18 1254  BP: 114/63 109/68  Pulse: 89 99  Resp: 19 18  Temp: 37 C 36.7 C  SpO2: 98% 99%    Last Pain:  Vitals:   05/14/18 1254  TempSrc: Oral  PainSc:                  Minerva AreolaYATES,[name]

## 2018-05-15 ENCOUNTER — Inpatient Hospital Stay (HOSPITAL_COMMUNITY): Admission: RE | Admit: 2018-05-15 | Payer: Medicaid Other | Source: Ambulatory Visit

## 2018-05-15 LAB — RPR: RPR Ser Ql: NONREACTIVE

## 2018-05-15 MED ORDER — IBUPROFEN 600 MG PO TABS: 600.0000 mg | ORAL_TABLET | Freq: Four times a day (QID) | ORAL | 0 refills | Status: DC

## 2018-05-15 MED ORDER — FERROUS SULFATE 325 (65 FE) MG PO TABS: 325.0000 mg | ORAL_TABLET | Freq: Two times a day (BID) | ORAL | 3 refills | Status: DC

## 2018-05-15 NOTE — Addendum Note (Signed)
Addendum  created 05/15/18 1149 by Franco NonesYates, Zoraida Havrilla S, CRNA   Charge Capture section accepted

## 2018-05-15 NOTE — Lactation Note (Addendum)
This note was copied from a baby's chart. Lactation Consultation Note Baby 24 hrs old, fussy.  Discussed w/mom starting to cluster feed time.  Mom demonstrated application of NS #20. Mom stated feels much better w/NS. While baby crying, tongue appears to have a low curl of tongue.  Encouraged "C" hold, once latched encouraged mom to talk to baby while latching and take deep breaths to relax. Mom's body tense. Baby feeding well. Noted nasal congestion. Discussed head tilting to allow nose to breath more. Baby appeared more comfortable after this. Basic newborn feeding habits and behavior reviewed. Mom has short shaft everted nipples. Shells given and encouraged to wear between feedings. Mom knows hand expression. RN sat up DEBP, mom pumped some colostrum. Encouraged to spoon feed baby. Mom know time limit on milk storage. Praised mom on her BF.  Patient Name: Yvonne [name] ZOXWR'UToday's Date: 05/15/2018 Reason for consult: Follow-up assessment;Mother's request;Difficult latch   Maternal Data    Feeding Feeding Type: Breast Fed Length of feed: 10 min(still BF)  LATCH Score Latch: Grasps breast easily, tongue down, lips flanged, rhythmical sucking.  Audible Swallowing: A few with stimulation  Type of Nipple: Everted at rest and after stimulation(short shaft)  Comfort (Breast/Nipple): Soft / non-tender  Hold (Positioning): Assistance needed to correctly position infant at breast and maintain latch.  LATCH Score: 8  Interventions Interventions: Breast feeding basics reviewed;Support pillows;Assisted with latch;Position options;Breast massage;Shells;Pre-pump if needed;DEBP;Breast compression;Adjust position  Lactation Tools Discussed/Used Tools: Pump;Shells;Nipple Shields Nipple shield size: 20 Shell Type: Inverted Breast pump type: Double-Electric Breast Pump WIC Program: Yes Pump Review: Setup, frequency, and cleaning Initiated by:: J.Acevedo RN   Consult  Status Consult Status: Follow-up Date: 05/15/18 Follow-up type: In-patient    Charyl DancerCARVER, [name] 05/15/2018, 1:56 AM

## 2018-05-15 NOTE — Discharge Instructions (Signed)
Vaginal Delivery, Care After °Refer to this sheet in the next few weeks. These instructions provide you with information about caring for yourself after vaginal delivery. Your health care provider may also give you more specific instructions. Your treatment has been planned according to current medical practices, but problems sometimes occur. Call your health care provider if you have any problems or questions. °What can I expect after the procedure? °After vaginal delivery, it is common to have: °· Some bleeding from your vagina. °· Soreness in your abdomen, your vagina, and the area of skin between your vaginal opening and your anus (perineum). °· Pelvic cramps. °· Fatigue. ° °Follow these instructions at home: °Medicines °· Take over-the-counter and prescription medicines only as told by your health care provider. °· If you were prescribed an antibiotic medicine, take it as told by your health care provider. Do not stop taking the antibiotic until it is finished. °Driving ° °· Do not drive or operate heavy machinery while taking prescription pain medicine. °· Do not drive for 24 hours if you received a sedative. °Lifestyle °· Do not drink alcohol. This is especially important if you are breastfeeding or taking medicine to relieve pain. °· Do not use tobacco products, including cigarettes, chewing tobacco, or e-cigarettes. If you need help quitting, ask your health care provider. °Eating and drinking °· Drink at least 8 eight-ounce glasses of water every day unless you are told not to by your health care provider. If you choose to breastfeed your baby, you may need to drink more water than this. °· Eat high-fiber foods every day. These foods may help prevent or relieve constipation. High-fiber foods include: °? Whole grain cereals and breads. °? Brown rice. °? Beans. °? Fresh fruits and vegetables. °Activity °· Return to your normal activities as told by your health care provider. Ask your health care provider  what activities are safe for you. °· Rest as much as possible. Try to rest or take a nap when your baby is sleeping. °· Do not lift anything that is heavier than your baby or 10 lb (4.5 kg) until your health care provider says that it is safe. °· Talk with your health care provider about when you can engage in sexual activity. This may depend on your: °? Risk of infection. °? Rate of healing. °? Comfort and desire to engage in sexual activity. °Vaginal Care °· If you have an episiotomy or a vaginal tear, check the area every day for signs of infection. Check for: °? More redness, swelling, or pain. °? More fluid or blood. °? Warmth. °? Pus or a bad smell. °· Do not use tampons or douches until your health care provider says this is safe. °· Watch for any blood clots that may pass from your vagina. These may look like clumps of dark red, brown, or black discharge. °General instructions °· Keep your perineum clean and dry as told by your health care provider. °· Wear loose, comfortable clothing. °· Wipe from front to back when you use the toilet. °· Ask your health care provider if you can shower or take a bath. If you had an episiotomy or a perineal tear during labor and delivery, your health care provider may tell you not to take baths for a certain length of time. °· Wear a bra that supports your breasts and fits you well. °· If possible, have someone help you with household activities and help care for your baby for at least a few days after   you leave the hospital. °· Keep all follow-up visits for you and your baby as told by your health care provider. This is important. °Contact a health care provider if: °· You have: °? Vaginal discharge that has a bad smell. °? Difficulty urinating. °? Pain when urinating. °? A sudden increase or decrease in the frequency of your bowel movements. °? More redness, swelling, or pain around your episiotomy or vaginal tear. °? More fluid or blood coming from your episiotomy or  vaginal tear. °? Pus or a bad smell coming from your episiotomy or vaginal tear. °? A fever. °? A rash. °? Little or no interest in activities you used to enjoy. °? Questions about caring for yourself or your baby. °· Your episiotomy or vaginal tear feels warm to the touch. °· Your episiotomy or vaginal tear is separating or does not appear to be healing. °· Your breasts are painful, hard, or turn red. °· You feel unusually sad or worried. °· You feel nauseous or you vomit. °· You pass large blood clots from your vagina. If you pass a blood clot from your vagina, save it to show to your health care provider. Do not flush blood clots down the toilet without having your health care provider look at them. °· You urinate more than usual. °· You are dizzy or light-headed. °· You have not breastfed at all and you have not had a menstrual period for 12 weeks after delivery. °· You have stopped breastfeeding and you have not had a menstrual period for 12 weeks after you stopped breastfeeding. °Get help right away if: °· You have: °? Pain that does not go away or does not get better with medicine. °? Chest pain. °? Difficulty breathing. °? Blurred vision or spots in your vision. °? Thoughts about hurting yourself or your baby. °· You develop pain in your abdomen or in one of your legs. °· You develop a severe headache. °· You faint. °· You bleed from your vagina so much that you fill two sanitary pads in one hour. °This information is not intended to replace advice given to you by your health care provider. Make sure you discuss any questions you have with your health care provider. °Document Released: 09/16/2000 Document Revised: 03/02/2016 Document Reviewed: 10/04/2015 °Elsevier Interactive Patient Education © 2018 Elsevier Inc. ° °

## 2018-05-15 NOTE — Lactation Note (Addendum)
This note was copied from a baby's chart. Lactation Consultation Note  Patient Name: Girl Norva KarvonenMakayla Williams VWUJW'JToday's Date: 05/15/2018     Maternal Data    Feeding Feeding Type: Formula Nipple Type: Slow - flow  LATCH Score                   Interventions    Lactation Tools Discussed/Used     Consult Status      Hardie PulleyBerkelhammer, Ruth Boschen 05/15/2018, 11:35 AM

## 2018-05-15 NOTE — Discharge Summary (Signed)
OB Discharge Summary     Patient Name: Yvonne Ayala DOB: 11/08/2000 MRN: [id number]  Date of admission: 05/13/2018 Delivering MD: Calvert CantorWEINHOLD, SAMANTHA C   Date of discharge: 05/15/2018  Admitting diagnosis: 40 WKS, CTXS, PRESSURE Intrauterine pregnancy: 3768w6d     Secondary diagnosis:  Active Problems:   Abdominal pain in pregnancy, third trimester   Vaginal delivery  Additional problems: none     Discharge diagnosis: Term Pregnancy Delivered                                                                                                Post partum procedures:  Augmentation: Pitocin  Complications: None  Hospital course:  Onset of Labor With Vaginal Delivery     17 y.o. yo G1P1001 at 4168w6d was admitted in Active Labor on 05/13/2018. Patient had an uncomplicated labor course as follows:  Membrane Rupture Time/Date: 11:15 PM ,05/13/2018   Intrapartum Procedures: Episiotomy: None [1]                                         Lacerations:  Perineal [11]  Patient had a delivery of a Viable infant. 05/14/2018  Information for the patient's newborn:  Lurline HareWilliams, Girl Briannie [[id number]][[id number]]  Delivery Method: Vaginal, Spontaneous(Filed from Delivery Summary)    Pateint had an uncomplicated postpartum course.  She is ambulating, tolerating a regular diet, passing flatus, and urinating well. Patient is discharged home in stable condition on 05/15/18.   Physical exam  Vitals:   05/14/18 1254 05/14/18 1659 05/14/18 2318 05/15/18 0532  BP: 109/68 126/80 120/77 119/84  Pulse: 99 95 85 81  Resp: 18 19 20 16   Temp: 98.1 F (36.7 C) 98.1 F (36.7 C) 98.1 F (36.7 C) 98.2 F (36.8 C)  TempSrc: Oral Oral Oral Oral  SpO2: 99% 99% 99%    General: alert, cooperative and no distress Lochia: appropriate Uterine Fundus: firm Incision: N/A DVT Evaluation: No evidence of DVT seen on physical exam. Negative Homan's sign. No cords or calf tenderness. No significant calf/ankle edema. Labs: Lab  Results  Component Value Date   WBC 16.3 (H) 05/13/2018   HGB 13.3 05/13/2018   HCT 39.0 05/13/2018   MCV 88.4 05/13/2018   PLT 182 05/13/2018   No flowsheet data found.  Discharge instruction: per After Visit Summary and "Baby and Me Booklet".  After visit meds:  Allergies as of 05/15/2018      Reactions   Vancomycin Itching      Medication List    TAKE these medications   ferrous sulfate 325 (65 FE) MG tablet Take 1 tablet (325 mg total) by mouth 2 (two) times daily with a meal.   ibuprofen 600 MG tablet Commonly known as:  ADVIL,MOTRIN Take 1 tablet (600 mg total) by mouth every 6 (six) hours.   prenatal vitamin w/FE, FA 27-1 MG Tabs tablet Take 1 tablet by mouth daily at 12 noon.       Diet: routine diet  Activity: Advance as tolerated. Pelvic  rest for 6 weeks.   Outpatient follow up:4 weeks Follow up Appt: Future Appointments  Date Time Provider Department Center  06/20/2018 11:00 AM Cresenzo-Dishmon, Scarlette CalicoFrances, CNM [facility]   Follow up Visit:No follow-ups on file.  Postpartum contraception: Progesterone only pills  Newborn Data: Live born female  Birth Weight: 7 lb 9.7 oz (3450 g) APGAR: 8, 9  Newborn Delivery   Birth date/time:  05/14/2018 01:53:00 Delivery type:  Vaginal, Spontaneous     Baby Feeding: Bottle Disposition:home with mother   05/15/2018 Levie HeritageJacob J Stinson, DO

## 2018-06-20 ENCOUNTER — Encounter: Payer: Self-pay | Admitting: Advanced Practice Midwife

## 2018-06-20 ENCOUNTER — Ambulatory Visit (INDEPENDENT_AMBULATORY_CARE_PROVIDER_SITE_OTHER): Payer: Medicaid Other | Admitting: Advanced Practice Midwife

## 2018-06-20 DIAGNOSIS — Z3202 Encounter for pregnancy test, result negative: Secondary | ICD-10-CM

## 2018-06-20 LAB — POCT URINE PREGNANCY: PREG TEST UR: NEGATIVE

## 2018-06-20 MED ORDER — NORETHIN-ETH ESTRAD-FE BIPHAS 1 MG-10 MCG / 10 MCG PO TABS: 1.0000 | ORAL_TABLET | Freq: Every day | ORAL | 11 refills | Status: DC

## 2018-06-20 NOTE — Patient Instructions (Signed)
Oral Contraception Information Oral contraceptive pills (OCPs) are medicines taken to prevent pregnancy. OCPs work by preventing the ovaries from releasing eggs. The hormones in OCPs also cause the cervical mucus to thicken, preventing the sperm from entering the uterus. The hormones also cause the uterine lining to become thin, not allowing a fertilized egg to attach to the inside of the uterus. OCPs are highly effective when taken exactly as prescribed. However, OCPs do not prevent sexually transmitted diseases (STDs). Safe sex practices, such as using condoms along with the pill, can help prevent STDs. Before taking the pill, you may have a physical exam and Pap test. Your health care provider may order blood tests. The health care provider will make sure you are a good candidate for oral contraception. Discuss with your health care provider the possible side effects of the OCP you may be prescribed. When starting an OCP, it can take 2 to 3 months for the body to adjust to the changes in hormone levels in your body. Types of oral contraception  The combination pill-This pill contains estrogen and progestin (synthetic progesterone) hormones. The combination pill comes in 21-day, 28-day, or 91-day packs. Some types of combination pills are meant to be taken continuously (365-day pills). With 21-day packs, you do not take pills for 7 days after the last pill. With 28-day packs, the pill is taken every day. The last 7 pills are without hormones. Certain types of pills have more than 21 hormone-containing pills. With 91-day packs, the first 84 pills contain both hormones, and the last 7 pills contain no hormones or contain estrogen only.  The minipill-This pill contains the progesterone hormone only. The pill is taken every day continuously. It is very important to take the pill at the same time each day. The minipill comes in packs of 28 pills. All 28 pills contain the hormone. Advantages of oral  contraceptive pills  Decreases premenstrual symptoms.  Treats menstrual period cramps.  Regulates the menstrual cycle.  Decreases a heavy menstrual flow.  May treatacne, depending on the type of pill.  Treats abnormal uterine bleeding.  Treats polycystic ovarian syndrome.  Treats endometriosis.  Can be used as emergency contraception. Things that can make oral contraceptive pills less effective OCPs can be less effective if:  You forget to take the pill at the same time every day.  You have a stomach or intestinal disease that lessens the absorption of the pill.  You take OCPs with other medicines that make OCPs less effective, such as antibiotics, certain HIV medicines, and some seizure medicines.  You take expired OCPs.  You forget to restart the pill on day 7, when using the packs of 21 pills.  Risks associated with oral contraceptive pills Oral contraceptive pills can sometimes cause side effects, such as:  Headache.  Nausea.  Breast tenderness.  Irregular bleeding or spotting.  Combination pills are also associated with a small increased risk of:  Blood clots.  Heart attack.  Stroke.  This information is not intended to replace advice given to you by your health care provider. Make sure you discuss any questions you have with your health care provider. Document Released: 12/10/2002 Document Revised: 02/25/2016 Document Reviewed: 03/10/2013 Elsevier Interactive Patient Education  2018 Elsevier Inc.  

## 2018-06-20 NOTE — Progress Notes (Signed)
Yvonne KarvonenMakayla Ayala is a 17 y.o. who presents for a postpartum visit. She is 5 weeks postpartum following a spontaneous vaginal delivery. I have fully reviewed the prenatal and intrapartum course. The delivery was at 40.6 gestational weeks.  Anesthesia: epidural. Postpartum course has been uneventful. Baby's course has been uneventful. Baby is feeding by bottle. Bleeding: started period on Friday. Bowel function is normal. Bladder function is normal. Patient is not sexually active. Contraception method is oral progesterone-only contraceptive. Postpartum depression screening: negative.   Current Outpatient Medications:  .  ferrous sulfate 325 (65 FE) MG tablet, Take 1 tablet (325 mg total) by mouth 2 (two) times daily with a meal., Disp: 60 tablet, Rfl: 3 .  ibuprofen (ADVIL,MOTRIN) 600 MG tablet, Take 1 tablet (600 mg total) by mouth every 6 (six) hours., Disp: 30 tablet, Rfl: 0 .  prenatal vitamin w/FE, FA (PRENATAL 1 + 1) 27-1 MG TABS tablet, Take 1 tablet by mouth daily at 12 noon., Disp: 30 each, Rfl: 12  Review of Systems   Constitutional: Negative for fever and chills Eyes: Negative for visual disturbances Respiratory: Negative for shortness of breath, dyspnea Cardiovascular: Negative for chest pain or palpitations  Gastrointestinal: Negative for vomiting, diarrhea and constipation Genitourinary: Negative for dysuria and urgency Musculoskeletal: Negative for back pain, joint pain, myalgias  Neurological: Negative for dizziness and headaches    Objective:     Vitals:   06/20/18 1122  BP: 108/71  Pulse: 70   General:  alert, cooperative and no distress   Breasts:  negative  Lungs: Normal respiratory effort  Heart:  regular rate and rhythm  Abdomen: Soft, nontender   Vulva:  normal  Vagina: normal vagina  Cervix:  closed  Corpus: Well involuted     Rectal Exam: no hemorrhoids        Assessment:    nomral postpartum exam.  Plan:   1. Contraception: OCP  (estrogen/progesterone) 2. Follow up in:  or as needed.

## 2018-07-24 DIAGNOSIS — Z029 Encounter for administrative examinations, unspecified: Secondary | ICD-10-CM

## 2019-09-09 ENCOUNTER — Other Ambulatory Visit: Payer: Self-pay

## 2019-09-09 DIAGNOSIS — Z20822 Contact with and (suspected) exposure to covid-19: Secondary | ICD-10-CM

## 2019-09-10 LAB — NOVEL CORONAVIRUS, NAA: SARS-CoV-2, NAA: DETECTED — AB

## 2020-06-18 ENCOUNTER — Telehealth: Payer: Self-pay | Admitting: Women's Health

## 2020-06-18 ENCOUNTER — Encounter: Payer: Self-pay | Admitting: *Deleted

## 2020-06-18 ENCOUNTER — Ambulatory Visit: Payer: Self-pay | Admitting: *Deleted

## 2020-06-18 ENCOUNTER — Other Ambulatory Visit: Payer: Self-pay | Admitting: Advanced Practice Midwife

## 2020-06-18 VITALS — BP 125/77 | HR 91 | Ht 68.0 in | Wt 247.0 lb

## 2020-06-18 DIAGNOSIS — Z3201 Encounter for pregnancy test, result positive: Secondary | ICD-10-CM

## 2020-06-18 LAB — POCT URINE PREGNANCY: Preg Test, Ur: POSITIVE — AB

## 2020-06-18 MED ORDER — CITRANATAL ASSURE 35-1 & 300 MG PO MISC: ORAL | 11 refills | Status: DC

## 2020-06-18 MED ORDER — CITRANATAL 90 DHA 90-1 & 300 MG PO MISC: 1.0000 | Freq: Every day | ORAL | 6 refills | Status: DC

## 2020-06-18 NOTE — Progress Notes (Addendum)
   NURSE VISIT- PREGNANCY CONFIRMATION   SUBJECTIVE:  Yvonne Ayala is a 19 y.o. G58P1001 female at [redacted]w[redacted]d by certain LMP of Patient's last menstrual period was 05/12/2020. Here for pregnancy confirmation.  Home pregnancy test: positive x four  She reports nausea.  She is not taking prenatal vitamins.    OBJECTIVE:  BP 125/77 (BP Location: Left Arm, Patient Position: Sitting, Cuff Size: Normal)   Pulse 91   Ht 5\' 8"  (1.727 m)   Wt 247 lb (112 kg)   LMP 05/12/2020   BMI 37.56 kg/m   Appears well, in no apparent distress OB History  Gravida Para Term Preterm AB Living  2 1 1     1   SAB TAB Ectopic Multiple Live Births        0 1    # Outcome Date GA Lbr Len/2nd Weight Sex Delivery Anes PTL Lv  2 Current           1 Term 05/14/18 [redacted]w[redacted]d 28:46 / 00:37 7 lb 9.7 oz (3.45 kg) F Vag-Spont EPI  LIV     Birth Comments: WNL    Results for orders placed or performed in visit on 06/18/20 (from the past 24 hour(s))  POCT urine pregnancy   Collection Time: 06/18/20 11:08 AM  Result Value Ref Range   Preg Test, Ur Positive (A) Negative    ASSESSMENT: Positive pregnancy test, [redacted]w[redacted]d by LMP    PLAN: Schedule for dating ultrasound in 2 weeks. Prenatal vitamins: note routed to 06/20/20, CNM to send prescription   Nausea medicines: not currently needed   OB packet given: Yes  [redacted]w[redacted]d  06/18/2020 11:09 AM   Chart reviewed for nurse visit. Agree with plan of care. Rx pnv Nance Pear, 06/20/2020 06/18/2020 12:50 PM

## 2020-06-18 NOTE — Addendum Note (Signed)
Addended by: Shawna Clamp R on: 06/18/2020 12:50 PM   Modules accepted: Orders

## 2020-06-18 NOTE — Telephone Encounter (Signed)
New rx sent

## 2020-06-18 NOTE — Telephone Encounter (Signed)
Yvonne Ayala at pharmacy [facility] states they no longer make citrnatal assure, requesting for provider to send new order.

## 2020-06-30 ENCOUNTER — Telehealth: Payer: Self-pay | Admitting: Women's Health

## 2020-06-30 NOTE — Telephone Encounter (Signed)
Patient states that pharmacy does not have the type of prenatals that was sent to pharmacy would like an alternate sent instead. And is also requesting nausea medication sent as well.

## 2020-07-01 ENCOUNTER — Other Ambulatory Visit: Payer: Self-pay | Admitting: Women's Health

## 2020-07-01 MED ORDER — DOXYLAMINE-PYRIDOXINE 10-10 MG PO TBEC: DELAYED_RELEASE_TABLET | ORAL | 6 refills | Status: DC

## 2020-07-01 MED ORDER — PRENATAL PLUS 27-1 MG PO TABS: 1.0000 | ORAL_TABLET | Freq: Every day | ORAL | 11 refills | Status: DC

## 2020-07-10 ENCOUNTER — Other Ambulatory Visit: Payer: Self-pay | Admitting: Obstetrics & Gynecology

## 2020-07-10 DIAGNOSIS — O3680X0 Pregnancy with inconclusive fetal viability, not applicable or unspecified: Secondary | ICD-10-CM

## 2020-07-13 ENCOUNTER — Ambulatory Visit (INDEPENDENT_AMBULATORY_CARE_PROVIDER_SITE_OTHER): Payer: Self-pay

## 2020-07-13 DIAGNOSIS — Z3A08 8 weeks gestation of pregnancy: Secondary | ICD-10-CM

## 2020-07-13 DIAGNOSIS — O3680X0 Pregnancy with inconclusive fetal viability, not applicable or unspecified: Secondary | ICD-10-CM

## 2020-07-13 NOTE — Progress Notes (Signed)
[place] 8+6 wks,single IUP,CRL 21.54 mm,fhr 160 bpm,normal ovaries

## 2020-08-12 ENCOUNTER — Encounter: Payer: Self-pay | Admitting: Advanced Practice Midwife

## 2020-08-12 DIAGNOSIS — Z3490 Encounter for supervision of normal pregnancy, unspecified, unspecified trimester: Secondary | ICD-10-CM | POA: Insufficient documentation

## 2020-08-13 ENCOUNTER — Other Ambulatory Visit: Payer: Self-pay | Admitting: Obstetrics & Gynecology

## 2020-08-13 DIAGNOSIS — Z3682 Encounter for antenatal screening for nuchal translucency: Secondary | ICD-10-CM

## 2020-08-14 ENCOUNTER — Ambulatory Visit (INDEPENDENT_AMBULATORY_CARE_PROVIDER_SITE_OTHER): Payer: Self-pay

## 2020-08-14 ENCOUNTER — Encounter: Payer: Self-pay | Admitting: Advanced Practice Midwife

## 2020-08-14 ENCOUNTER — Ambulatory Visit: Payer: Self-pay | Admitting: *Deleted

## 2020-08-14 ENCOUNTER — Ambulatory Visit (INDEPENDENT_AMBULATORY_CARE_PROVIDER_SITE_OTHER): Payer: Self-pay | Admitting: Advanced Practice Midwife

## 2020-08-14 ENCOUNTER — Other Ambulatory Visit (HOSPITAL_COMMUNITY)
Admission: RE | Admit: 2020-08-14 | Discharge: 2020-08-14 | Disposition: A | Discharge status: Home / Self Care | Payer: Self-pay | Source: Ambulatory Visit | Attending: Advanced Practice Midwife | Admitting: Advanced Practice Midwife

## 2020-08-14 VITALS — BP 122/76 | HR 87 | Wt 243.0 lb

## 2020-08-14 DIAGNOSIS — Z124 Encounter for screening for malignant neoplasm of cervix: Secondary | ICD-10-CM | POA: Insufficient documentation

## 2020-08-14 DIAGNOSIS — Z3480 Encounter for supervision of other normal pregnancy, unspecified trimester: Secondary | ICD-10-CM

## 2020-08-14 DIAGNOSIS — Z3A13 13 weeks gestation of pregnancy: Secondary | ICD-10-CM

## 2020-08-14 DIAGNOSIS — Z3682 Encounter for antenatal screening for nuchal translucency: Secondary | ICD-10-CM

## 2020-08-14 DIAGNOSIS — Z363 Encounter for antenatal screening for malformations: Secondary | ICD-10-CM

## 2020-08-14 DIAGNOSIS — Z1379 Encounter for other screening for genetic and chromosomal anomalies: Secondary | ICD-10-CM

## 2020-08-14 NOTE — Progress Notes (Signed)
INITIAL OBSTETRICAL VISIT Patient name: Yvonne Ayala MRN [id number]  Date of birth: 01/23/2001 Chief Complaint:   Initial Prenatal Visit  History of Present Illness:   Yvonne Ayala is a 19 y.o. G5P1001 female at [redacted]w[redacted]d by LMP c/w u/s at 8.6 weeks with an Estimated Date of Delivery: 02/16/21 being seen today for her initial obstetrical visit.   Her obstetrical history is significant for SVD x 1, no complications during preg or delivery.   Today she reports feeling pretty good.  Depression screen [facility] 2/9 08/14/2020 10/16/2017 09/07/2017  Decreased Interest 0 0 0  Down, Depressed, Hopeless 0 0 0  PHQ - 2 Score 0 0 0  Altered sleeping 1 0 0  Tired, decreased energy 0 0 0  Change in appetite 1 0 0  Feeling bad or failure about yourself  0 0 0  Trouble concentrating 0 0 0  Moving slowly or fidgety/restless 0 0 0  Suicidal thoughts 0 0 0  PHQ-9 Score 2 0 0    Patient's last menstrual period was 05/12/2020. Last pap never.  Review of Systems:   Pertinent items are noted in HPI Denies cramping/contractions, leakage of fluid, vaginal bleeding, abnormal vaginal discharge w/ itching/odor/irritation, headaches, visual changes, shortness of breath, chest pain, abdominal pain, severe nausea/vomiting, or problems with urination or bowel movements unless otherwise stated above.  Pertinent History Reviewed:  Reviewed past medical,surgical, social, obstetrical and family history.  Reviewed problem list, medications and allergies. OB History  Gravida Para Term Preterm AB Living  2 1 1     1   SAB TAB Ectopic Multiple Live Births        0 1    # Outcome Date GA Lbr Len/2nd Weight Sex Delivery Anes PTL Lv  2 Current           1 Term 05/14/18 [redacted]w[redacted]d 28:46 / 00:37 7 lb 9.7 oz (3.45 kg) F Vag-Spont EPI  LIV     Birth Comments: WNL   Physical Assessment:   Vitals:   08/14/20 1111  BP: 122/76  Pulse: 87  Weight: 243 lb (110.2 kg)  Body mass index is 36.95 kg/m.       Physical  Examination:  General appearance - well appearing, and in no distress  Mental status - alert, oriented to person, place, and time  Psych:  She has a normal mood and affect  Skin - warm and dry, normal color, no suspicious lesions noted  Chest - effort normal, all lung fields clear to auscultation bilaterally  Heart - normal rate and regular rhythm  Abdomen - soft, nontender  Extremities:  No swelling or varicosities noted  Pelvic - VULVA: normal appearing vulva with no masses, tenderness or lesions  VAGINA: normal appearing vagina with normal color and discharge, no lesions  CERVIX: normal appearing cervix without discharge or lesions, no CMT  Thin prep pap is done without HR HPV cotesting  Chaperone: 13/12/21    TODAY'S NT Malachy Mood 13+3 wks,measurements c/w dates,crl 80.55 mm,fhr 150 bpm,normal ovaries,anterior placenta,NB present,NT 1.7 mm  No results found for this or any previous visit (from the past 24 hour(s)).  Assessment & Plan:  1) Low-Risk Pregnancy G2P1001 at [redacted]w[redacted]d with an Estimated Date of Delivery: 02/16/21   2) Initial OB visit  3) ADHD, no meds  Meds: No orders of the defined types were placed in this encounter.   Initial labs obtained Continue prenatal vitamins Reviewed n/v relief measures and warning s/s to report Reviewed recommended  weight gain based on pre-gravid BMI Encouraged well-balanced diet Genetic & carrier screening discussed: requests Panorama and NT/IT, declines Horizon 14  Ultrasound discussed; fetal survey: requested CCNC completed> form faxed if has or is planning to apply for medicaid The nature of [facility] [employer] with multiple MDs and other Advanced Practice Providers was explained to patient; also emphasized that fellows, residents, and students are part of our team. Will look into home bp cuff at next visit. Check bp weekly, let us know if >140/90.   No indications for ASA therapy or early HgbA1c (per  uptodate)   Follow-up: Return in about 5 weeks (around 09/21/2020) for 2nd IT, LROB, [place]: Anatomy, in person.   Orders Placed This Encounter  Procedures  . Urine Culture  . US OB Comp + 14 Wk  . Integrated 1  . Pain Management Screening Profile (10S)  . CBC/D/Plt+RPR+Rh+ABO+Rub Ab...    Arabella Merles CNM 08/14/2020 12:05 PM

## 2020-08-14 NOTE — Patient Instructions (Signed)
Yvonne Ayala, I greatly value your feedback.  If you receive a survey following your visit with [place] today, we appreciate you taking the time to fill it out.  Thanks, Philipp Deputy, CNM   [facility] ([address] [place], [place] [address]) Entrance C, located off of [address] Free 24/7 valet parking   Nausea & Vomiting  Have saltine crackers or pretzels by your bed and eat a few bites before you raise your head out of bed in the morning  Eat small frequent meals throughout the day instead of large meals  Drink plenty of fluids throughout the day to stay hydrated, just don't drink a lot of fluids with your meals.  This can make your stomach fill up faster making you feel sick  Do not brush your teeth right after you eat  Products with real ginger are good for nausea, like ginger ale and ginger hard candy Make sure it says made with real ginger!  Sucking on sour candy like lemon heads is also good for nausea  If your prenatal vitamins make you nauseated, take them at night so you will sleep through the nausea  Sea Bands  If you feel like you need medicine for the nausea & vomiting please let us know  If you are unable to keep any fluids or food down please let us know   Constipation  Drink plenty of fluid, preferably water, throughout the day  Eat foods high in fiber such as fruits, vegetables, and grains  Exercise, such as walking, is a good way to keep your bowels regular  Drink warm fluids, especially warm prune juice, or decaf coffee  Eat a 1/2 cup of real oatmeal (not instant), 1/2 cup applesauce, and 1/2-1 cup warm prune juice every day  If needed, you may take Colace (docusate sodium) stool softener once or twice a day to help keep the stool soft.   If you still are having problems with constipation, you may take Miralax once daily as needed to help keep your bowels regular.   Home Blood Pressure Monitoring for Patients   Your  provider has recommended that you check your blood pressure (BP) at least once a week at home. If you do not have a blood pressure cuff at home, one will be provided for you. Contact your provider if you have not received your monitor within 1 week.   Helpful Tips for Accurate Home Blood Pressure Checks  . Don't smoke, exercise, or drink caffeine 30 minutes before checking your BP . Use the restroom before checking your BP (a full bladder can raise your pressure) . Relax in a comfortable upright chair . Feet on the ground . Left arm resting comfortably on a flat surface at the level of your heart . Legs uncrossed . Back supported . Sit quietly and don't talk . Place the cuff on your bare arm . Adjust snuggly, so that only two fingertips can fit between your skin and the top of the cuff . Check 2 readings separated by at least one minute . Keep a log of your BP readings . For a visual, please reference this diagram: http://ccnc.care/bpdiagram  Provider Name: [facility]     Phone: [phone]  Zone 1: ALL CLEAR  Continue to monitor your symptoms:  . BP reading is less than 140 (top number) or less than 90 (bottom number)  . No right upper stomach pain . No headaches or seeing  spots . No feeling nauseated or throwing up . No swelling in face and hands  Zone 2: CAUTION Call your doctor's office for any of the following:  . BP reading is greater than 140 (top number) or greater than 90 (bottom number)  . Stomach pain under your ribs in the middle or right side . Headaches or seeing spots . Feeling nauseated or throwing up . Swelling in face and hands  Zone 3: EMERGENCY  Seek immediate medical care if you have any of the following:  . BP reading is greater than160 (top number) or greater than 110 (bottom number) . Severe headaches not improving with Tylenol . Serious difficulty catching your breath . Any worsening symptoms from Zone 2    First Trimester of Pregnancy The  first trimester of pregnancy is from week 1 until the end of week 12 (months 1 through 3). A week after a sperm fertilizes an egg, the egg will implant on the wall of the uterus. This embryo will begin to develop into a baby. Genes from you and your partner are forming the baby. The female genes determine whether the baby is a boy or a girl. At 6-8 weeks, the eyes and face are formed, and the heartbeat can be seen on ultrasound. At the end of 12 weeks, all the baby's organs are formed.  Now that you are pregnant, you will want to do everything you can to have a healthy baby. Two of the most important things are to get good prenatal care and to follow your health care provider's instructions. Prenatal care is all the medical care you receive before the baby's birth. This care will help prevent, find, and treat any problems during the pregnancy and childbirth. BODY CHANGES Your body goes through many changes during pregnancy. The changes vary from woman to woman.   You may gain or lose a couple of pounds at first.  You may feel sick to your stomach (nauseous) and throw up (vomit). If the vomiting is uncontrollable, call your health care provider.  You may tire easily.  You may develop headaches that can be relieved by medicines approved by your health care provider.  You may urinate more often. Painful urination may mean you have a bladder infection.  You may develop heartburn as a result of your pregnancy.  You may develop constipation because certain hormones are causing the muscles that push waste through your intestines to slow down.  You may develop hemorrhoids or swollen, bulging veins (varicose veins).  Your breasts may begin to grow larger and become tender. Your nipples may stick out more, and the tissue that surrounds them (areola) may become darker.  Your gums may bleed and may be sensitive to brushing and flossing.  Dark spots or blotches (chloasma, mask of pregnancy) may develop on  your face. This will likely fade after the baby is born.  Your menstrual periods will stop.  You may have a loss of appetite.  You may develop cravings for certain kinds of food.  You may have changes in your emotions from day to day, such as being excited to be pregnant or being concerned that something may go wrong with the pregnancy and baby.  You may have more vivid and strange dreams.  You may have changes in your hair. These can include thickening of your hair, rapid growth, and changes in texture. Some women also have hair loss during or after pregnancy, or hair that feels dry or thin. Your hair  will most likely return to normal after your baby is born. WHAT TO EXPECT AT YOUR PRENATAL VISITS During a routine prenatal visit:  You will be weighed to make sure you and the baby are growing normally.  Your blood pressure will be taken.  Your abdomen will be measured to track your baby's growth.  The fetal heartbeat will be listened to starting around week 10 or 12 of your pregnancy.  Test results from any previous visits will be discussed. Your health care provider may ask you:  How you are feeling.  If you are feeling the baby move.  If you have had any abnormal symptoms, such as leaking fluid, bleeding, severe headaches, or abdominal cramping.  If you have any questions. Other tests that may be performed during your first trimester include:  Blood tests to find your blood type and to check for the presence of any previous infections. They will also be used to check for low iron levels (anemia) and Rh antibodies. Later in the pregnancy, blood tests for diabetes will be done along with other tests if problems develop.  Urine tests to check for infections, diabetes, or protein in the urine.  An ultrasound to confirm the proper growth and development of the baby.  An amniocentesis to check for possible genetic problems.  Fetal screens for spina bifida and Down  syndrome.  You may need other tests to make sure you and the baby are doing well. HOME CARE INSTRUCTIONS  Medicines  Follow your health care provider's instructions regarding medicine use. Specific medicines may be either safe or unsafe to take during pregnancy.  Take your prenatal vitamins as directed.  If you develop constipation, try taking a stool softener if your health care provider approves. Diet  Eat regular, well-balanced meals. Choose a variety of foods, such as meat or vegetable-based protein, fish, milk and low-fat dairy products, vegetables, fruits, and whole grain breads and cereals. Your health care provider will help you determine the amount of weight gain that is right for you.  Avoid raw meat and uncooked cheese. These carry germs that can cause birth defects in the baby.  Eating four or five small meals rather than three large meals a day may help relieve nausea and vomiting. If you start to feel nauseous, eating a few soda crackers can be helpful. Drinking liquids between meals instead of during meals also seems to help nausea and vomiting.  If you develop constipation, eat more high-fiber foods, such as fresh vegetables or fruit and whole grains. Drink enough fluids to keep your urine clear or pale yellow. Activity and Exercise  Exercise only as directed by your health care provider. Exercising will help you:  Control your weight.  Stay in shape.  Be prepared for labor and delivery.  Experiencing pain or cramping in the lower abdomen or low back is a good sign that you should stop exercising. Check with your health care provider before continuing normal exercises.  Try to avoid standing for long periods of time. Move your legs often if you must stand in one place for a long time.  Avoid heavy lifting.  Wear low-heeled shoes, and practice good posture.  You may continue to have sex unless your health care provider directs you otherwise. Relief of Pain or  Discomfort  Wear a good support bra for breast tenderness.    Take warm sitz baths to soothe any pain or discomfort caused by hemorrhoids. Use hemorrhoid cream if your health care provider approves.  Rest with your legs elevated if you have leg cramps or low back pain.  If you develop varicose veins in your legs, wear support hose. Elevate your feet for 15 minutes, 3-4 times a day. Limit salt in your diet. Prenatal Care  Schedule your prenatal visits by the twelfth week of pregnancy. They are usually scheduled monthly at first, then more often in the last 2 months before delivery.  Write down your questions. Take them to your prenatal visits.  Keep all your prenatal visits as directed by your health care provider. Safety  Wear your seat belt at all times when driving.  Make a list of emergency phone numbers, including numbers for family, friends, the hospital, and police and fire departments. General Tips  Ask your health care provider for a referral to a local prenatal education class. Begin classes no later than at the beginning of month 6 of your pregnancy.  Ask for help if you have counseling or nutritional needs during pregnancy. Your health care provider can offer advice or refer you to specialists for help with various needs.  Do not use hot tubs, steam rooms, or saunas.  Do not douche or use tampons or scented sanitary pads.  Do not cross your legs for long periods of time.  Avoid cat litter boxes and soil used by cats. These carry germs that can cause birth defects in the baby and possibly loss of the fetus by miscarriage or stillbirth.  Avoid all smoking, herbs, alcohol, and medicines not prescribed by your health care provider. Chemicals in these affect the formation and growth of the baby.  Schedule a dentist appointment. At home, brush your teeth with a soft toothbrush and be gentle when you floss. SEEK MEDICAL CARE IF:   You have dizziness.  You have mild  pelvic cramps, pelvic pressure, or nagging pain in the abdominal area.  You have persistent nausea, vomiting, or diarrhea.  You have a bad smelling vaginal discharge.  You have pain with urination.  You notice increased swelling in your face, hands, legs, or ankles. SEEK IMMEDIATE MEDICAL CARE IF:   You have a fever.  You are leaking fluid from your vagina.  You have spotting or bleeding from your vagina.  You have severe abdominal cramping or pain.  You have rapid weight gain or loss.  You vomit blood or material that looks like coffee grounds.  You are exposed to [place] measles and have never had them.  You are exposed to fifth disease or chickenpox.  You develop a severe headache.  You have shortness of breath.  You have any kind of trauma, such as from a fall or a car accident. Document Released: 09/13/2001 Document Revised: 02/03/2014 Document Reviewed: 07/30/2013 [facility] Patient Information 2015 [place], [place]. This information is not intended to replace advice given to you by your health care provider. Make sure you discuss any questions you have with your health care provider.

## 2020-08-14 NOTE — Progress Notes (Signed)
[place] 13+3 wks,measurements c/w dates,crl 80.55 mm,fhr 150 bpm,normal ovaries,anterior placenta,NB present,NT 1.7 mm

## 2020-08-15 LAB — INTEGRATED 1

## 2020-08-15 LAB — CBC/D/PLT+RPR+RH+ABO+RUB AB...
Antibody Screen: NEGATIVE
EOS (ABSOLUTE): 0.1 10*3/uL (ref 0.0–0.4)
Hematocrit: 38.8 % (ref 34.0–46.6)
Hemoglobin: 13.2 g/dL (ref 11.1–15.9)
Hepatitis B Surface Ag: NEGATIVE
Immature Grans (Abs): 0 10*3/uL (ref 0.0–0.1)
MCHC: 34 g/dL (ref 31.5–35.7)
Monocytes: 6 %
Neutrophils Absolute: 4.5 10*3/uL (ref 1.4–7.0)
RDW: 13.6 % (ref 11.7–15.4)
Rh Factor: POSITIVE
Rubella Antibodies, IGG: 13.3 index (ref >0.99)
WBC: 7.1 10*3/uL (ref 3.4–10.8)

## 2020-08-15 LAB — HCV INTERPRETATION

## 2020-08-16 LAB — URINE CULTURE

## 2020-08-17 LAB — CBC/D/PLT+RPR+RH+ABO+RUB AB...
Basophils Absolute: 0 10*3/uL (ref 0.0–0.2)
Basos: 1 %
Eos: 2 %
HCV Ab: <0.1 s/co ratio (ref 0.0–0.9)
HIV Screen 4th Generation wRfx: NONREACTIVE
Immature Granulocytes: 0 %
Lymphocytes Absolute: 2 10*3/uL (ref 0.7–3.1)
Lymphs: 28 %
MCH: 29.3 pg (ref 26.6–33.0)
MCV: 86 fL (ref 79–97)
Monocytes Absolute: 0.4 10*3/uL (ref 0.1–0.9)
Neutrophils: 63 %
Platelets: 245 10*3/uL (ref 150–450)
RBC: 4.51 x10E6/uL (ref 3.77–5.28)
RPR Ser Ql: NONREACTIVE

## 2020-08-17 LAB — INTEGRATED 1
Crown Rump Length: 80.6 mm
Gest. Age on Collection Date: 13.7 weeks
PAPP-A Value: 1755.9 ng/mL
Weight: 243 [lb_av]

## 2020-08-19 LAB — CYTOLOGY - PAP
Chlamydia: NEGATIVE
Comment: NEGATIVE
Comment: NORMAL
Diagnosis: NEGATIVE
Neisseria Gonorrhea: NEGATIVE

## 2020-08-19 LAB — PMP SCREEN PROFILE (10S), URINE
Amphetamine Scrn, Ur: NEGATIVE ng/mL
BARBITURATE SCREEN URINE: NEGATIVE ng/mL
BENZODIAZEPINE SCREEN, URINE: NEGATIVE ng/mL
CANNABINOIDS UR QL SCN: NEGATIVE ng/mL
Cocaine (Metab) Scrn, Ur: NEGATIVE ng/mL
Creatinine(Crt), U: 258.8 mg/dL (ref 20.0–300.0)
Methadone Screen, Urine: NEGATIVE ng/mL
OXYCODONE+OXYMORPHONE UR QL SCN: NEGATIVE ng/mL
Opiate Scrn, Ur: NEGATIVE ng/mL
Ph of Urine: 6.5 (ref 4.5–8.9)
Phencyclidine Qn, Ur: NEGATIVE ng/mL
Propoxyphene Scrn, Ur: NEGATIVE ng/mL

## 2020-09-28 ENCOUNTER — Ambulatory Visit (INDEPENDENT_AMBULATORY_CARE_PROVIDER_SITE_OTHER): Payer: Self-pay

## 2020-09-28 ENCOUNTER — Encounter: Payer: Self-pay | Admitting: Advanced Practice Midwife

## 2020-09-28 ENCOUNTER — Other Ambulatory Visit: Payer: Self-pay

## 2020-09-28 ENCOUNTER — Ambulatory Visit (INDEPENDENT_AMBULATORY_CARE_PROVIDER_SITE_OTHER): Payer: Self-pay | Admitting: Advanced Practice Midwife

## 2020-09-28 VITALS — BP 128/74 | HR 87 | Wt 250.0 lb

## 2020-09-28 DIAGNOSIS — Z1379 Encounter for other screening for genetic and chromosomal anomalies: Secondary | ICD-10-CM

## 2020-09-28 DIAGNOSIS — Z3A19 19 weeks gestation of pregnancy: Secondary | ICD-10-CM

## 2020-09-28 DIAGNOSIS — O35EXX0 Maternal care for other (suspected) fetal abnormality and damage, fetal genitourinary anomalies, not applicable or unspecified: Secondary | ICD-10-CM

## 2020-09-28 DIAGNOSIS — Z363 Encounter for antenatal screening for malformations: Secondary | ICD-10-CM

## 2020-09-28 DIAGNOSIS — O358XX0 Maternal care for other (suspected) fetal abnormality and damage, not applicable or unspecified: Secondary | ICD-10-CM

## 2020-09-28 DIAGNOSIS — Z3482 Encounter for supervision of other normal pregnancy, second trimester: Secondary | ICD-10-CM

## 2020-09-28 DIAGNOSIS — Z1389 Encounter for screening for other disorder: Secondary | ICD-10-CM

## 2020-09-28 DIAGNOSIS — Z3480 Encounter for supervision of other normal pregnancy, unspecified trimester: Secondary | ICD-10-CM

## 2020-09-28 NOTE — Progress Notes (Signed)
   LOW-RISK PREGNANCY VISIT Patient name: Yvonne Ayala MRN [id number]  Date of birth: [birth year] Chief Complaint:   Routine Prenatal Visit  History of Present Illness:   Yvonne Ayala is a 19 y.o. G61P1001 female at [redacted]w[redacted]d with an Estimated Date of Delivery: 02/16/21 being seen today for ongoing management of a low-risk pregnancy.  Today she reports no complaints.  . Vag. Bleeding: None.  Movement: Present. denies leaking of fluid. Review of Systems:   Pertinent items are noted in HPI Denies abnormal vaginal discharge w/ itching/odor/irritation, headaches, visual changes, shortness of breath, chest pain, abdominal pain, severe nausea/vomiting, or problems with urination or bowel movements unless otherwise stated above. Pertinent History Reviewed:  Reviewed past medical,surgical, social, obstetrical and family history.  Reviewed problem list, medications and allergies. Physical Assessment:   Vitals:   09/28/20 1410  BP: 128/74  Pulse: 87  Weight: 250 lb (113.4 kg)  Body mass index is 38.01 kg/m.        Physical Examination:   General appearance: Well appearing, and in no distress  Mental status: Alert, oriented to person, place, and time  Skin: Warm & dry  Cardiovascular: Normal heart rate noted  Respiratory: Normal respiratory effort, no distress  Abdomen: Soft, gravid, nontender  Pelvic: Cervical exam deferred         Extremities: Edema: None  Fetal Status:     Movement: Present   [place] 19+6 wks,breech,anterior placenta gr 0,normal ovaries,fhr 144 bpm,cx 4.4 cm,svp of fluid 4.4 cm,mild bilat renal pelvic dilation,LK 5 mm,RK 4 mm,EFW 330 g 56%,anatomy complete Chaperone: n/a    No results found for this or any previous visit (from the past 24 hour(s)).  Assessment & Plan:  1) Low-risk pregnancy G2P1001 at [redacted]w[redacted]d with an Estimated Date of Delivery: 02/16/21      Meds: No orders of the defined types were placed in this encounter.  Labs/procedures today: anatomy scan  Plan:   Continue routine obstetrical care  Next visit: prefers online    Reviewed: Preterm labor symptoms and general obstetric precautions including but not limited to vaginal bleeding, contractions, leaking of fluid and fetal movement were reviewed in detail with the patient.  All questions were answered. Has home bp cuff. Check bp weekly, let us know if >140/90.   Follow-up: Return in about 4 weeks (around 10/26/2020).  Orders Placed This Encounter  Procedures  . INTEGRATED 2  . POC Urinalysis Dipstick OB   Jacklyn Shell DNP, CNM 09/28/2020 2:37 PM

## 2020-09-28 NOTE — Patient Instructions (Signed)
Yvonne Ayala, I greatly value your feedback.  If you receive a survey following your visit with [place] today, we appreciate you taking the time to fill it out.  Thanks, Cathie Beams, CNM     [facility] HAS MOVED!!! It is now [facility] at [facility] ([address] [place], [place] [address]) Entrance located off of [address] Free 24/7 valet parking   Go to [email or web address] to register for FREE online childbirth classes    Second Trimester of Pregnancy The second trimester is from week 14 through week 27 (months 4 through 6). The second trimester is often a time when you feel your best. Your body has adjusted to being pregnant, and you begin to feel better physically. Usually, morning sickness has lessened or quit completely, you may have more energy, and you may have an increase in appetite. The second trimester is also a time when the fetus is growing rapidly. At the end of the sixth month, the fetus is about 9 inches long and weighs about 1 pounds. You will likely begin to feel the baby move (quickening) between 16 and 20 weeks of pregnancy. Body changes during your second trimester Your body continues to go through many changes during your second trimester. The changes vary from woman to woman.  Your weight will continue to increase. You will notice your lower abdomen bulging out.  You may begin to get stretch marks on your hips, abdomen, and breasts.  You may develop headaches that can be relieved by medicines. The medicines should be approved by your health care provider.  You may urinate more often because the fetus is pressing on your bladder.  You may develop or continue to have heartburn as a result of your pregnancy.  You may develop constipation because certain hormones are causing the muscles that push waste through your intestines to slow down.  You may develop hemorrhoids or swollen, bulging veins (varicose veins).  You may have  back pain. This is caused by: ? Weight gain. ? Pregnancy hormones that are relaxing the joints in your pelvis. ? A shift in weight and the muscles that support your balance.  Your breasts will continue to grow and they will continue to become tender.  Your gums may bleed and may be sensitive to brushing and flossing.  Dark spots or blotches (chloasma, mask of pregnancy) may develop on your face. This will likely fade after the baby is born.  A dark line from your belly button to the pubic area (linea nigra) may appear. This will likely fade after the baby is born.  You may have changes in your hair. These can include thickening of your hair, rapid growth, and changes in texture. Some women also have hair loss during or after pregnancy, or hair that feels dry or thin. Your hair will most likely return to normal after your baby is born.  What to expect at prenatal visits During a routine prenatal visit:  You will be weighed to make sure you and the fetus are growing normally.  Your blood pressure will be taken.  Your abdomen will be measured to track your baby's growth.  The fetal heartbeat will be listened to.  Any test results from the previous visit will be discussed.  Your health care provider may ask you:  How you are feeling.  If you are feeling the baby move.  If you have had any abnormal symptoms, such as leaking fluid, bleeding, severe headaches, or  abdominal cramping.  If you are using any tobacco products, including cigarettes, chewing tobacco, and electronic cigarettes.  If you have any questions.  Other tests that may be performed during your second trimester include:  Blood tests that check for: ? Low iron levels (anemia). ? High blood sugar that affects pregnant women (gestational diabetes) between 24 and 28 weeks. ? Rh antibodies. This is to check for a protein on red blood cells (Rh factor).  Urine tests to check for infections, diabetes, or protein in  the urine.  An ultrasound to confirm the proper growth and development of the baby.  An amniocentesis to check for possible genetic problems.  Fetal screens for spina bifida and Down syndrome.  HIV (human immunodeficiency virus) testing. Routine prenatal testing includes screening for HIV, unless you choose not to have this test.  Follow these instructions at home: Medicines  Follow your health care provider's instructions regarding medicine use. Specific medicines may be either safe or unsafe to take during pregnancy.  Take a prenatal vitamin that contains at least 600 micrograms (mcg) of folic acid.  If you develop constipation, try taking a stool softener if your health care provider approves. Eating and drinking  Eat a balanced diet that includes fresh fruits and vegetables, whole grains, good sources of protein such as meat, eggs, or tofu, and low-fat dairy. Your health care provider will help you determine the amount of weight gain that is right for you.  Avoid raw meat and uncooked cheese. These carry germs that can cause birth defects in the baby.  If you have low calcium intake from food, talk to your health care provider about whether you should take a daily calcium supplement.  Limit foods that are high in fat and processed sugars, such as fried and sweet foods.  To prevent constipation: ? Drink enough fluid to keep your urine clear or pale yellow. ? Eat foods that are high in fiber, such as fresh fruits and vegetables, whole grains, and beans. Activity  Exercise only as directed by your health care provider. Most women can continue their usual exercise routine during pregnancy. Try to exercise for 30 minutes at least 5 days a week. Stop exercising if you experience uterine contractions.  Avoid heavy lifting, wear low heel shoes, and practice good posture.  A sexual relationship may be continued unless your health care provider directs you otherwise. Relieving pain  and discomfort  Wear a good support bra to prevent discomfort from breast tenderness.  Take warm sitz baths to soothe any pain or discomfort caused by hemorrhoids. Use hemorrhoid cream if your health care provider approves.  Rest with your legs elevated if you have leg cramps or low back pain.  If you develop varicose veins, wear support hose. Elevate your feet for 15 minutes, 3-4 times a day. Limit salt in your diet. Prenatal Care  Write down your questions. Take them to your prenatal visits.  Keep all your prenatal visits as told by your health care provider. This is important. Safety  Wear your seat belt at all times when driving.  Make a list of emergency phone numbers, including numbers for family, friends, the hospital, and police and fire departments. General instructions  Ask your health care provider for a referral to a local prenatal education class. Begin classes no later than the beginning of month 6 of your pregnancy.  Ask for help if you have counseling or nutritional needs during pregnancy. Your health care provider can offer advice or   refer you to specialists for help with various needs.  Do not use hot tubs, steam rooms, or saunas.  Do not douche or use tampons or scented sanitary pads.  Do not cross your legs for long periods of time.  Avoid cat litter boxes and soil used by cats. These carry germs that can cause birth defects in the baby and possibly loss of the fetus by miscarriage or stillbirth.  Avoid all smoking, herbs, alcohol, and unprescribed drugs. Chemicals in these products can affect the formation and growth of the baby.  Do not use any products that contain nicotine or tobacco, such as cigarettes and e-cigarettes. If you need help quitting, ask your health care provider.  Visit your dentist if you have not gone yet during your pregnancy. Use a soft toothbrush to brush your teeth and be gentle when you floss. Contact a health care provider  if:  You have dizziness.  You have mild pelvic cramps, pelvic pressure, or nagging pain in the abdominal area.  You have persistent nausea, vomiting, or diarrhea.  You have a bad smelling vaginal discharge.  You have pain when you urinate. Get help right away if:  You have a fever.  You are leaking fluid from your vagina.  You have spotting or bleeding from your vagina.  You have severe abdominal cramping or pain.  You have rapid weight gain or weight loss.  You have shortness of breath with chest pain.  You notice sudden or extreme swelling of your face, hands, ankles, feet, or legs.  You have not felt your baby move in over an hour.  You have severe headaches that do not go away when you take medicine.  You have vision changes. Summary  The second trimester is from week 14 through week 27 (months 4 through 6). It is also a time when the fetus is growing rapidly.  Your body goes through many changes during pregnancy. The changes vary from woman to woman.  Avoid all smoking, herbs, alcohol, and unprescribed drugs. These chemicals affect the formation and growth your baby.  Do not use any tobacco products, such as cigarettes, chewing tobacco, and e-cigarettes. If you need help quitting, ask your health care provider.  Contact your health care provider if you have any questions. Keep all prenatal visits as told by your health care provider. This is important. This information is not intended to replace advice given to you by your health care provider. Make sure you discuss any questions you have with your health care provider.

## 2020-09-28 NOTE — Progress Notes (Signed)
[place] 19+6 wks,breech,anterior placenta gr 0,normal ovaries,fhr 144 bpm,cx 4.4 cm,svp of fluid 4.4 cm,mild bilat renal pelvic dilation,LK 5 mm,RK 4 mm,EFW 330 g 56%,anatomy complete

## 2020-09-30 LAB — INTEGRATED 2
AFP MoM: 1.09
Alpha-Fetoprotein: 40.1 ng/mL
Crown Rump Length: 80.6 mm
DIA MoM: 1.3
DIA Value: 186.5 pg/mL
Estriol, Unconjugated: 1.72 ng/mL
Gest. Age on Collection Date: 13.7 weeks
Gestational Age: 20.1 weeks
Maternal Age at EDD: 22.3 yr
Nuchal Translucency (NT): 1.7 mm
Nuchal Translucency MoM: 0.89
Number of Fetuses: 1
PAPP-A MoM: 2.02
PAPP-A Value: 1755.9 ng/mL
Test Results:: NEGATIVE
Weight: 243 [lb_av]
Weight: 243 [lb_av]
hCG MoM: 0.9
hCG Value: 14 IU/mL
uE3 MoM: 0.87

## 2020-10-03 NOTE — L&D Delivery Note (Signed)
   Delivery Note Progressed rapidly through labor, had SROM w/clear fluid at 0519. At 5:36 AM a viable female was delivered via Vaginal, Spontaneous (Presentation: Left Occiput Anterior).  APGAR: 9, 9; weight pending. After 1 minute, the cord was clamped and cut. 40 units of pitocin diluted in 1000cc LR was infused rapidly IV.  The placenta separated spontaneously and delivered via CCT and maternal pushing effort.  It was inspected and appears to be intact with a 3 VC.   Anesthesia: Epidural Episiotomy: None Lacerations: None Suture Repair:  Est. Blood Loss (mL): 225  Mom to postpartum.  Baby to Couplet care / Skin to Skin.  Yvonne Ayala 02/16/2021, 6:02 AM

## 2020-10-07 ENCOUNTER — Encounter: Payer: Self-pay | Admitting: *Deleted

## 2020-10-07 DIAGNOSIS — Z3480 Encounter for supervision of other normal pregnancy, unspecified trimester: Secondary | ICD-10-CM

## 2020-10-13 ENCOUNTER — Telehealth: Payer: Self-pay | Admitting: Women's Health

## 2020-10-13 NOTE — Telephone Encounter (Signed)
Patient called stating that she is an OB patient and she would like to speak with a nurse, The patient did not give me any more details than this. Please contact pt

## 2020-10-13 NOTE — Telephone Encounter (Signed)
Pt has a sharp pain in stomach, below belly button. Pain started today around 2:15 pm. + nausea and no diarrhea.  Started having braxton hick's contraction's last week. BM last night. No spotting or bleeding. I advised it could be muscles stretching and she may benefit from using a belly band. Pt advised if anything changes, let us know. Pt voiced understanding. JSY

## 2020-10-30 ENCOUNTER — Telehealth: Payer: Self-pay | Admitting: Medical

## 2020-11-02 ENCOUNTER — Ambulatory Visit (INDEPENDENT_AMBULATORY_CARE_PROVIDER_SITE_OTHER): Payer: No Typology Code available for payment source | Admitting: Obstetrics & Gynecology

## 2020-11-02 ENCOUNTER — Encounter: Payer: Self-pay | Admitting: Obstetrics & Gynecology

## 2020-11-02 ENCOUNTER — Other Ambulatory Visit: Payer: Self-pay

## 2020-11-02 ENCOUNTER — Encounter: Payer: Self-pay | Admitting: Women's Health

## 2020-11-02 VITALS — BP 116/69 | HR 87 | Wt 254.0 lb

## 2020-11-02 DIAGNOSIS — Z3480 Encounter for supervision of other normal pregnancy, unspecified trimester: Secondary | ICD-10-CM

## 2020-11-02 NOTE — Progress Notes (Signed)
   LOW-RISK PREGNANCY VISIT Patient name: Yvonne Ayala MRN [id number]  Date of birth: 2001/06/11 Chief Complaint:   Routine Prenatal Visit (Work in for spotting and not feeling baby )  History of Present Illness:   Yvonne Ayala is a 20 y.o. G28P1001 female at [redacted]w[redacted]d with an Estimated Date of Delivery: 02/16/21 being seen today for ongoing management of a low-risk pregnancy.   FHR good today and no bleeding just spotting when wipred Depression screen [facility] 2/9 08/14/2020 10/16/2017 09/07/2017  Decreased Interest 0 0 0  Down, Depressed, Hopeless 0 0 0  PHQ - 2 Score 0 0 0  Altered sleeping 1 0 0  Tired, decreased energy 0 0 0  Change in appetite 1 0 0  Feeling bad or failure about yourself  0 0 0  Trouble concentrating 0 0 0  Moving slowly or fidgety/restless 0 0 0  Suicidal thoughts 0 0 0  PHQ-9 Score 2 0 0    Today she reports no complaints.  . Vag. Bleeding: Scant.  Movement: (!) Decreased. denies leaking of fluid. Review of Systems:   Pertinent items are noted in HPI Denies abnormal vaginal discharge w/ itching/odor/irritation, headaches, visual changes, shortness of breath, chest pain, abdominal pain, severe nausea/vomiting, or problems with urination or bowel movements unless otherwise stated above. Pertinent History Reviewed:  Reviewed past medical,surgical, social, obstetrical and family history.  Reviewed problem list, medications and allergies. Physical Assessment:   Vitals:   11/02/20 1109  BP: 116/69  Pulse: 87  Weight: 254 lb (115.2 kg)  Body mass index is 38.62 kg/m.        Physical Examination:   General appearance: Well appearing, and in no distress  Mental status: Alert, oriented to person, place, and time  Skin: Warm & dry  Cardiovascular: Normal heart rate noted  Respiratory: Normal respiratory effort, no distress  Abdomen: Soft, gravid, nontender  Pelvic: Cervical exam deferred         Extremities: Edema: None  Fetal Status:     Movement: (!)  Decreased    Chaperone: n/a    No results found for this or any previous visit (from the past 24 hour(s)).  Assessment & Plan:  1) Low-risk pregnancy G2P1001 at [redacted]w[redacted]d with an Estimated Date of Delivery: 02/16/21   2) Had no movement for about 24 hours after her daughter jumped on her,    Meds: No orders of the defined types were placed in this encounter.  Labs/procedures today:   Plan:  Continue routine obstetrical care  Next visit: prefers in person    Reviewed: Preterm labor symptoms and general obstetric precautions including but not limited to vaginal bleeding, contractions, leaking of fluid and fetal movement were reviewed in detail with the patient.  All questions were answered. Has home bp cuff. Rx faxed to . Check bp weekly, let us know if >140/90.   Follow-up: Return for cancel 11/13/20 appt, make LROB 3 weeks + PN2, LROB.  No orders of the defined types were placed in this encounter.   Lazaro Arms, MD 11/02/2020 11:46 AM

## 2020-11-05 ENCOUNTER — Other Ambulatory Visit: Payer: No Typology Code available for payment source

## 2020-11-05 DIAGNOSIS — Z20822 Contact with and (suspected) exposure to covid-19: Secondary | ICD-10-CM

## 2020-11-07 LAB — NOVEL CORONAVIRUS, NAA: SARS-CoV-2, NAA: DETECTED — AB

## 2020-11-07 LAB — SARS-COV-2, NAA 2 DAY TAT

## 2020-11-08 ENCOUNTER — Telehealth: Payer: Self-pay | Admitting: Physician Assistant

## 2020-11-08 NOTE — Telephone Encounter (Signed)
Called to discuss with patient about Covid symptoms and the use of a monoclonal antibody infusion for those with mild to moderate Covid symptoms and at a high risk of hospitalization.   Pt is qualified for the monoclonal antibody infusion due to pregnancy and BMI. Onset 11/03/20. Will call us if interested.   Preventative practices reviewed. Patient verbalized understanding.  [place], [place]  11/08/2020 10:53 AM

## 2020-11-13 ENCOUNTER — Telehealth: Payer: Self-pay | Admitting: Medical

## 2020-11-16 ENCOUNTER — Encounter: Payer: Self-pay | Admitting: Obstetrics & Gynecology

## 2020-11-16 ENCOUNTER — Other Ambulatory Visit: Payer: Self-pay

## 2020-11-16 ENCOUNTER — Telehealth: Payer: Self-pay | Admitting: *Deleted

## 2020-11-16 ENCOUNTER — Ambulatory Visit (INDEPENDENT_AMBULATORY_CARE_PROVIDER_SITE_OTHER): Payer: No Typology Code available for payment source | Admitting: Obstetrics & Gynecology

## 2020-11-16 VITALS — BP 108/70 | HR 90 | Wt 255.0 lb

## 2020-11-16 DIAGNOSIS — Z3480 Encounter for supervision of other normal pregnancy, unspecified trimester: Secondary | ICD-10-CM

## 2020-11-16 NOTE — Progress Notes (Signed)
Work in [employer]    LOW-RISK PREGNANCY VISIT Patient name: Yvonne Ayala MRN [id number]  Date of birth: 2000-10-19 Chief Complaint:   Routine Prenatal Visit (W/I cramping and brown discharge)  History of Present Illness:   Yvonne Ayala is a 20 y.o. G52P1001 female at [redacted]w[redacted]d with an Estimated Date of Delivery: 02/16/21 being seen today for ongoing management of a low-risk pregnancy.  Depression screen [facility] 2/9 08/14/2020 10/16/2017 09/07/2017  Decreased Interest 0 0 0  Down, Depressed, Hopeless 0 0 0  PHQ - 2 Score 0 0 0  Altered sleeping 1 0 0  Tired, decreased energy 0 0 0  Change in appetite 1 0 0  Feeling bad or failure about yourself  0 0 0  Trouble concentrating 0 0 0  Moving slowly or fidgety/restless 0 0 0  Suicidal thoughts 0 0 0  PHQ-9 Score 2 0 0    Today she reports some. Contractions: Not present. Vag. Bleeding: None.  Movement: Present. denies leaking of fluid. Review of Systems:   Pertinent items are noted in HPI Denies abnormal vaginal discharge w/ itching/odor/irritation, headaches, visual changes, shortness of breath, chest pain, abdominal pain, severe nausea/vomiting, or problems with urination or bowel movements unless otherwise stated above. Pertinent History Reviewed:  Reviewed past medical,surgical, social, obstetrical and family history.  Reviewed problem list, medications and allergies. Physical Assessment:   Vitals:   11/16/20 1339  BP: 108/70  Pulse: 90  Weight: 255 lb (115.7 kg)  Body mass index is 38.77 kg/m.        Physical Examination:   General appearance: Well appearing, and in no distress  Mental status: Alert, oriented to person, place, and time  Skin: Warm & dry  Cardiovascular: Normal heart rate noted  Respiratory: Normal respiratory effort, no distress  Abdomen: Soft, gravid, nontender  Pelvic: Cervical exam performed         Extremities: Edema: None  Fetal Status:     Movement: Present    Chaperone: Amanda Rash    No results  found for this or any previous visit (from the past 24 hour(s)).  Assessment & Plan:  1) Low-risk pregnancy G2P1001 at [redacted]w[redacted]d with an Estimated Date of Delivery: 02/16/21   2) cervical mucous, no bleeding noted, no cervical dilation cervix LTC   Meds: No orders of the defined types were placed in this encounter.  Labs/procedures today:   Plan:  Continue routine obstetrical care  Next visit: prefers in person    Reviewed: Preterm labor symptoms and general obstetric precautions including but not limited to vaginal bleeding, contractions, leaking of fluid and fetal movement were reviewed in detail with the patient.  All questions were answered. has home bp cuff. Rx faxed to . Check bp weekly, let us know if >140/90.   Follow-up: Return for keep scheduled.  No orders of the defined types were placed in this encounter.   Lazaro Arms, MD 11/20/2020 8:09 PM

## 2020-11-16 NOTE — Telephone Encounter (Signed)
Patient states she has noticed a light pink to brown color mucous since last Tuesday every time she goes to the bathroom.  She is also having vaginal cramping. Baby is active.  Informed patient she should be seen to evaluate the bleeding and vaginal pain.  Will have her come in later this afternoon.

## 2020-11-20 ENCOUNTER — Encounter: Payer: Self-pay | Admitting: Obstetrics & Gynecology

## 2020-11-27 ENCOUNTER — Other Ambulatory Visit: Payer: No Typology Code available for payment source

## 2020-11-27 ENCOUNTER — Ambulatory Visit (INDEPENDENT_AMBULATORY_CARE_PROVIDER_SITE_OTHER): Payer: No Typology Code available for payment source | Admitting: Advanced Practice Midwife

## 2020-11-27 ENCOUNTER — Other Ambulatory Visit: Payer: Self-pay

## 2020-11-27 VITALS — BP 124/78 | HR 95 | Wt 255.0 lb

## 2020-11-27 DIAGNOSIS — Z3480 Encounter for supervision of other normal pregnancy, unspecified trimester: Secondary | ICD-10-CM

## 2020-11-27 DIAGNOSIS — Z23 Encounter for immunization: Secondary | ICD-10-CM | POA: Diagnosis not present

## 2020-11-27 DIAGNOSIS — Z131 Encounter for screening for diabetes mellitus: Secondary | ICD-10-CM | POA: Diagnosis not present

## 2020-11-27 DIAGNOSIS — Z3A28 28 weeks gestation of pregnancy: Secondary | ICD-10-CM

## 2020-11-27 DIAGNOSIS — Z3483 Encounter for supervision of other normal pregnancy, third trimester: Secondary | ICD-10-CM

## 2020-11-27 NOTE — Patient Instructions (Signed)
Yvonne Ayala, I greatly value your feedback.  If you receive a survey following your visit with [place] today, we appreciate you taking the time to fill it out.  Thanks, Philipp Deputy, CNM   [facility] ([address] [place], [place] [address]) Entrance C, located off of [address] valet parking  Go to [email or web address] to register for FREE online childbirth classes   Call the office [phone]) or go to [facility] if:  You begin to have strong, frequent contractions  Your water breaks.  Sometimes it is a big gush of fluid, sometimes it is just a trickle that keeps getting your panties wet or running down your legs  You have vaginal bleeding.  It is normal to have a small amount of spotting if your cervix was checked.   You don't feel your baby moving like normal.  If you don't, get you something to eat and drink and lay down and focus on feeling your baby move.  You should feel at least 10 movements in 2 hours.  If you don't, you should call the office or go to [facility].    Tdap Vaccine  It is recommended that you get the Tdap vaccine during the third trimester of EACH pregnancy to help protect your baby from getting pertussis (whooping cough)  27-36 weeks is the BEST time to do this so that you can pass the protection on to your baby. During pregnancy is better than after pregnancy, but if you are unable to get it during pregnancy it will be offered at the hospital.   You can get this vaccine with [place], at the health department, your family doctor, or some local pharmacies  Everyone who will be around your baby should also be up-to-date on their vaccines before the baby comes. Adults (who are not pregnant) only need 1 dose of Tdap during adulthood.   [place] Pediatricians/Family Doctors:  [facility] [phone]            [facility] [phone]                 [facility] [phone]  (usually not accepting new patients unless you have family there already, you are always welcome to call and ask)       [facility] Department [phone]       [facility] Pediatricians/Family Doctors:   [facility]: [phone]  [facility]: [phone]  [facility]: [phone]  [facility] Doctors:   [facility]: [phone]   [facility]: [phone]  [facility] Doctors:  [facility]: [phone]   Home Blood Pressure Monitoring for Patients   Your provider has recommended that you check your blood pressure (BP) at least once a week at home. If you do not have a blood pressure cuff at home, one will be provided for you. Contact your provider if you have not received your monitor within 1 week.   Helpful Tips for Accurate Home Blood Pressure Checks  . Don't smoke, exercise, or drink caffeine 30 minutes before checking your BP . Use the restroom before checking your BP (a full bladder can raise your pressure) . Relax in a comfortable upright chair . Feet on the ground . Left arm resting comfortably on a flat surface at the level of your heart . Legs uncrossed . Back supported . Sit quietly and don't talk . Place the cuff on your bare  arm . Adjust snuggly, so that only two fingertips can fit between your skin and the top of the cuff . Check 2 readings separated by at least one minute . Keep a log of your BP readings . For a visual, please reference this diagram: http://ccnc.care/bpdiagram  Provider Name: [facility]     Phone: [phone]  Zone 1: ALL CLEAR  Continue to monitor your symptoms:  . BP reading is less than 140 (top number) or less than 90 (bottom number)  . No right upper stomach pain . No headaches or seeing spots . No feeling nauseated or throwing up . No swelling in face and hands  Zone 2: CAUTION Call your doctor's office for  any of the following:  . BP reading is greater than 140 (top number) or greater than 90 (bottom number)  . Stomach pain under your ribs in the middle or right side . Headaches or seeing spots . Feeling nauseated or throwing up . Swelling in face and hands  Zone 3: EMERGENCY  Seek immediate medical care if you have any of the following:  . BP reading is greater than160 (top number) or greater than 110 (bottom number) . Severe headaches not improving with Tylenol . Serious difficulty catching your breath . Any worsening symptoms from Zone 2   Third Trimester of Pregnancy The third trimester is from week 29 through week 42, months 7 through 9. The third trimester is a time when the fetus is growing rapidly. At the end of the ninth month, the fetus is about 20 inches in length and weighs 6-10 pounds.  BODY CHANGES Your body goes through many changes during pregnancy. The changes vary from woman to woman.   Your weight will continue to increase. You can expect to gain 25-35 pounds (11-16 kg) by the end of the pregnancy.  You may begin to get stretch marks on your hips, abdomen, and breasts.  You may urinate more often because the fetus is moving lower into your pelvis and pressing on your bladder.  You may develop or continue to have heartburn as a result of your pregnancy.  You may develop constipation because certain hormones are causing the muscles that push waste through your intestines to slow down.  You may develop hemorrhoids or swollen, bulging veins (varicose veins).  You may have pelvic pain because of the weight gain and pregnancy hormones relaxing your joints between the bones in your pelvis. Backaches may result from overexertion of the muscles supporting your posture.  You may have changes in your hair. These can include thickening of your hair, rapid growth, and changes in texture. Some women also have hair loss during or after pregnancy, or hair that feels dry or thin.  Your hair will most likely return to normal after your baby is born.  Your breasts will continue to grow and be tender. A yellow discharge may leak from your breasts called colostrum.  Your belly button may stick out.  You may feel short of breath because of your expanding uterus.  You may notice the fetus "dropping," or moving lower in your abdomen.  You may have a bloody mucus discharge. This usually occurs a few days to a week before labor begins.  Your cervix becomes thin and soft (effaced) near your due date. WHAT TO EXPECT AT YOUR PRENATAL EXAMS  You will have prenatal exams every 2 weeks until week 36. Then, you will have weekly prenatal exams. During a routine prenatal visit:  You  will be weighed to make sure you and the fetus are growing normally.  Your blood pressure is taken.  Your abdomen will be measured to track your baby's growth.  The fetal heartbeat will be listened to.  Any test results from the previous visit will be discussed.  You may have a cervical check near your due date to see if you have effaced. At around 36 weeks, your caregiver will check your cervix. At the same time, your caregiver will also perform a test on the secretions of the vaginal tissue. This test is to determine if a type of bacteria, Group B streptococcus, is present. Your caregiver will explain this further. Your caregiver may ask you:  What your birth plan is.  How you are feeling.  If you are feeling the baby move.  If you have had any abnormal symptoms, such as leaking fluid, bleeding, severe headaches, or abdominal cramping.  If you have any questions. Other tests or screenings that may be performed during your third trimester include:  Blood tests that check for low iron levels (anemia).  Fetal testing to check the health, activity level, and growth of the fetus. Testing is done if you have certain medical conditions or if there are problems during the pregnancy. FALSE  LABOR You may feel small, irregular contractions that eventually go away. These are called Braxton Hicks contractions, or false labor. Contractions may last for hours, days, or even weeks before true labor sets in. If contractions come at regular intervals, intensify, or become painful, it is best to be seen by your caregiver.  SIGNS OF LABOR   Menstrual-like cramps.  Contractions that are 5 minutes apart or less.  Contractions that start on the top of the uterus and spread down to the lower abdomen and back.  A sense of increased pelvic pressure or back pain.  A watery or bloody mucus discharge that comes from the vagina. If you have any of these signs before the 37th week of pregnancy, call your caregiver right away. You need to go to the hospital to get checked immediately. HOME CARE INSTRUCTIONS   Avoid all smoking, herbs, alcohol, and unprescribed drugs. These chemicals affect the formation and growth of the baby.  Follow your caregiver's instructions regarding medicine use. There are medicines that are either safe or unsafe to take during pregnancy.  Exercise only as directed by your caregiver. Experiencing uterine cramps is a good sign to stop exercising.  Continue to eat regular, healthy meals.  Wear a good support bra for breast tenderness.  Do not use hot tubs, steam rooms, or saunas.  Wear your seat belt at all times when driving.  Avoid raw meat, uncooked cheese, cat litter boxes, and soil used by cats. These carry germs that can cause birth defects in the baby.  Take your prenatal vitamins.  Try taking a stool softener (if your caregiver approves) if you develop constipation. Eat more high-fiber foods, such as fresh vegetables or fruit and whole grains. Drink plenty of fluids to keep your urine clear or pale yellow.  Take warm sitz baths to soothe any pain or discomfort caused by hemorrhoids. Use hemorrhoid cream if your caregiver approves.  If you develop varicose  veins, wear support hose. Elevate your feet for 15 minutes, 3-4 times a day. Limit salt in your diet.  Avoid heavy lifting, wear low heal shoes, and practice good posture.  Rest a lot with your legs elevated if you have leg cramps or low back  pain.  Visit your dentist if you have not gone during your pregnancy. Use a soft toothbrush to brush your teeth and be gentle when you floss.  A sexual relationship may be continued unless your caregiver directs you otherwise.  Do not travel far distances unless it is absolutely necessary and only with the approval of your caregiver.  Take prenatal classes to understand, practice, and ask questions about the labor and delivery.  Make a trial run to the hospital.  Pack your hospital bag.  Prepare the baby's nursery.  Continue to go to all your prenatal visits as directed by your caregiver. SEEK MEDICAL CARE IF:  You are unsure if you are in labor or if your water has broken.  You have dizziness.  You have mild pelvic cramps, pelvic pressure, or nagging pain in your abdominal area.  You have persistent nausea, vomiting, or diarrhea.  You have a bad smelling vaginal discharge.  You have pain with urination. SEEK IMMEDIATE MEDICAL CARE IF:   You have a fever.  You are leaking fluid from your vagina.  You have spotting or bleeding from your vagina.  You have severe abdominal cramping or pain.  You have rapid weight loss or gain.  You have shortness of breath with chest pain.  You notice sudden or extreme swelling of your face, hands, ankles, feet, or legs.  You have not felt your baby move in over an hour.  You have severe headaches that do not go away with medicine.  You have vision changes. Document Released: 09/13/2001 Document Revised: 09/24/2013 Document Reviewed: 11/20/2012 [employer] Patient Information 2015 [place], [place]. This information is not intended to replace advice given to you by your health care provider. Make  sure you discuss any questions you have with your health care provider.

## 2020-11-27 NOTE — Progress Notes (Signed)
   LOW-RISK PREGNANCY VISIT Patient name: Yvonne Ayala MRN [id number]  Date of birth: 19-Jan-2001 Chief Complaint:   Routine Prenatal Visit  History of Present Illness:   Yvonne Ayala is a 20 y.o. G31P1001 female at [redacted]w[redacted]d with an Estimated Date of Delivery: 02/16/21 being seen today for ongoing management of a low-risk pregnancy.  Today she reports no complaints. Contractions: Not present. Vag. Bleeding: None.  Movement: Present. denies leaking of fluid. Review of Systems:   Pertinent items are noted in HPI Denies abnormal vaginal discharge w/ itching/odor/irritation, headaches, visual changes, shortness of breath, chest pain, abdominal pain, severe nausea/vomiting, or problems with urination or bowel movements unless otherwise stated above. Pertinent History Reviewed:  Reviewed past medical,surgical, social, obstetrical and family history.  Reviewed problem list, medications and allergies. Physical Assessment:   Vitals:   11/27/20 0857  BP: 124/78  Pulse: 95  Weight: 255 lb (115.7 kg)  Body mass index is 38.77 kg/m.        Physical Examination:   General appearance: Well appearing, and in no distress  Mental status: Alert, oriented to person, place, and time  Skin: Warm & dry  Cardiovascular: Normal heart rate noted  Respiratory: Normal respiratory effort, no distress  Abdomen: Soft, gravid, nontender  Pelvic: Cervical exam deferred         Extremities: Edema: None  Fetal Status: Fetal Heart Rate (bpm): 138 Fundal Height: 28 cm Movement: Present    No results found for this or any previous visit (from the past 24 hour(s)).  Assessment & Plan:  1) Low-risk pregnancy G2P1001 at [redacted]w[redacted]d with an Estimated Date of Delivery: 02/16/21   2) Mild bilat pyelectasis, get f/u scan @ 32wks   Meds: No orders of the defined types were placed in this encounter.  Labs/procedures today: PN2, Tdap  Plan:  Continue routine obstetrical care   Reviewed: Preterm labor symptoms and general  obstetric precautions including but not limited to vaginal bleeding, contractions, leaking of fluid and fetal movement were reviewed in detail with the patient.  All questions were answered. Didn't ask about home bp cuff. Check bp weekly, let us know if >140/90.   Follow-up: Return in about 2 weeks (around 12/11/2020) for LROB, in person.  Orders Placed This Encounter  Procedures  . Tdap vaccine greater than or equal to 7yo IM  . POC Urinalysis Dipstick OB   [facility] 11/27/2020 9:12 AM

## 2020-11-28 LAB — CBC
Hematocrit: 35.4 % (ref 34.0–46.6)
Hemoglobin: 11.9 g/dL (ref 11.1–15.9)
MCH: 29.5 pg (ref 26.6–33.0)
MCHC: 33.6 g/dL (ref 31.5–35.7)
MCV: 88 fL (ref 79–97)
Platelets: 209 10*3/uL (ref 150–450)
RBC: 4.04 x10E6/uL (ref 3.77–5.28)
RDW: 13.6 % (ref 11.7–15.4)
WBC: 9.3 10*3/uL (ref 3.4–10.8)

## 2020-11-28 LAB — ANTIBODY SCREEN: Antibody Screen: NEGATIVE

## 2020-11-28 LAB — HIV ANTIBODY (ROUTINE TESTING W REFLEX): HIV Screen 4th Generation wRfx: NONREACTIVE

## 2020-11-28 LAB — GLUCOSE TOLERANCE, 2 HOURS W/ 1HR
Glucose, 1 hour: 118 mg/dL (ref 65–179)
Glucose, 2 hour: 125 mg/dL (ref 65–152)
Glucose, Fasting: 85 mg/dL (ref 65–91)

## 2020-11-28 LAB — RPR: RPR Ser Ql: NONREACTIVE

## 2020-12-14 ENCOUNTER — Encounter: Payer: Self-pay | Admitting: Women's Health

## 2020-12-14 ENCOUNTER — Other Ambulatory Visit: Payer: Self-pay

## 2020-12-14 ENCOUNTER — Ambulatory Visit (INDEPENDENT_AMBULATORY_CARE_PROVIDER_SITE_OTHER): Payer: 59 | Admitting: Women's Health

## 2020-12-14 VITALS — BP 117/76 | HR 99 | Wt 260.0 lb

## 2020-12-14 DIAGNOSIS — O358XX0 Maternal care for other (suspected) fetal abnormality and damage, not applicable or unspecified: Secondary | ICD-10-CM

## 2020-12-14 DIAGNOSIS — Z3483 Encounter for supervision of other normal pregnancy, third trimester: Secondary | ICD-10-CM

## 2020-12-14 DIAGNOSIS — Z3480 Encounter for supervision of other normal pregnancy, unspecified trimester: Secondary | ICD-10-CM

## 2020-12-14 DIAGNOSIS — O35EXX0 Maternal care for other (suspected) fetal abnormality and damage, fetal genitourinary anomalies, not applicable or unspecified: Secondary | ICD-10-CM

## 2020-12-14 NOTE — Patient Instructions (Signed)
Yvonne Ayala, I greatly value your feedback.  If you receive a survey following your visit with us today, we appreciate you taking the time to fill it out.  Thanks, Yvonne Ayala, CNM, WHNP-BC  [facility] ([address] [place], [place] [address]) Entrance C, located off of [address] Free 24/7 valet parking   Go to [email or web address] to register for FREE online childbirth classes    Call the office ([phone]) or go to [facility] if:  You begin to have strong, frequent contractions  Your water breaks.  Sometimes it is a big gush of fluid, sometimes it is just a trickle that keeps getting your panties wet or running down your legs  You have vaginal bleeding.  It is normal to have a small amount of spotting if your cervix was checked.   You don't feel your baby moving like normal.  If you don't, get you something to eat and drink and lay down and focus on feeling your baby move.  You should feel at least 10 movements in 2 hours.  If you don't, you should call the office or go to [facility].   Call the office ([phone]) or go to [facility] for these signs of pre-eclampsia:  Severe headache that does not go away with Tylenol  Visual changes- seeing spots, double, blurred vision  Pain under your right breast or upper abdomen that does not go away with Tums or heartburn medicine  Nausea and/or vomiting  Severe swelling in your hands, feet, and face    Home Blood Pressure Monitoring for Patients   Your provider has recommended that you check your blood pressure (BP) at least once a week at home. If you do not have a blood pressure cuff at home, one will be provided for you. Contact your provider if you have not received your monitor within 1 week.   Helpful Tips for Accurate Home Blood Pressure Checks  . Don't smoke, exercise, or drink caffeine 30 minutes before checking your BP . Use the restroom before checking your BP (a full  bladder can raise your pressure) . Relax in a comfortable upright chair . Feet on the ground . Left arm resting comfortably on a flat surface at the level of your heart . Legs uncrossed . Back supported . Sit quietly and don't talk . Place the cuff on your bare arm . Adjust snuggly, so that only two fingertips can fit between your skin and the top of the cuff . Check 2 readings separated by at least one minute . Keep a log of your BP readings . For a visual, please reference this diagram: http://ccnc.care/bpdiagram  Provider Name: [facility]     Phone: [phone]  Zone 1: ALL CLEAR  Continue to monitor your symptoms:  . BP reading is less than 140 (top number) or less than 90 (bottom number)  . No right upper stomach pain . No headaches or seeing spots . No feeling nauseated or throwing up . No swelling in face and hands  Zone 2: CAUTION Call your doctor's office for any of the following:  . BP reading is greater than 140 (top number) or greater than 90 (bottom number)  . Stomach pain under your ribs in the middle or right side . Headaches or seeing spots . Feeling nauseated or throwing up . Swelling in face and hands  Zone 3: EMERGENCY  Seek immediate medical care if you have any of   the following:  . BP reading is greater than160 (top number) or greater than 110 (bottom number) . Severe headaches not improving with Tylenol . Serious difficulty catching your breath . Any worsening symptoms from Zone 2  Preterm Labor and Birth Information  The normal length of a pregnancy is 39-41 weeks. Preterm labor is when labor starts before 37 completed weeks of pregnancy. What are the risk factors for preterm labor? Preterm labor is more likely to occur in women who:  Have certain infections during pregnancy such as a bladder infection, sexually transmitted infection, or infection inside the uterus (chorioamnionitis).  Have a shorter-than-normal cervix.  Have gone into  preterm labor before.  Have had surgery on their cervix.  Are younger than age 54 or older than age 25.  Are African American.  Are pregnant with twins or multiple babies (multiple gestation).  Take street drugs or smoke while pregnant.  Do not gain enough weight while pregnant.  Became pregnant shortly after having been pregnant. What are the symptoms of preterm labor? Symptoms of preterm labor include:  Cramps similar to those that can happen during a menstrual period. The cramps may happen with diarrhea.  Pain in the abdomen or lower back.  Regular uterine contractions that may feel like tightening of the abdomen.  A feeling of increased pressure in the pelvis.  Increased watery or bloody mucus discharge from the vagina.  Water breaking (ruptured amniotic sac). Why is it important to recognize signs of preterm labor? It is important to recognize signs of preterm labor because babies who are born prematurely may not be fully developed. This can put them at an increased risk for:  Long-term (chronic) heart and lung problems.  Difficulty immediately after birth with regulating body systems, including blood sugar, body temperature, heart rate, and breathing rate.  Bleeding in the brain.  Cerebral palsy.  Learning difficulties.  Death. These risks are highest for babies who are born before 48 weeks of pregnancy. How is preterm labor treated? Treatment depends on the length of your pregnancy, your condition, and the health of your baby. It may involve: 1. Having a stitch (suture) placed in your cervix to prevent your cervix from opening too early (cerclage). 2. Taking or being given medicines, such as: ? Hormone medicines. These may be given early in pregnancy to help support the pregnancy. ? Medicine to stop contractions. ? Medicines to help mature the baby's lungs. These may be prescribed if the risk of delivery is high. ? Medicines to prevent your baby from  developing cerebral palsy. If the labor happens before 34 weeks of pregnancy, you may need to stay in the hospital. What should I do if I think I am in preterm labor? If you think that you are going into preterm labor, call your health care provider right away. How can I prevent preterm labor in future pregnancies? To increase your chance of having a full-term pregnancy:  Do not use any tobacco products, such as cigarettes, chewing tobacco, and e-cigarettes. If you need help quitting, ask your health care provider.  Do not use street drugs or medicines that have not been prescribed to you during your pregnancy.  Talk with your health care provider before taking any herbal supplements, even if you have been taking them regularly.  Make sure you gain a healthy amount of weight during your pregnancy.  Watch for infection. If you think that you might have an infection, get it checked right away.  Make sure to  tell your health care provider if you have gone into preterm labor before. This information is not intended to replace advice given to you by your health care provider. Make sure you discuss any questions you have with your health care provider. Document Revised: 01/11/2019 Document Reviewed: 02/10/2016 [employer] Patient Education  [employer].

## 2020-12-14 NOTE — Progress Notes (Signed)
   LOW-RISK PREGNANCY VISIT Patient name: Yvonne Ayala MRN [id number]  Date of birth: [birth year] Chief Complaint:   Routine Prenatal Visit  History of Present Illness:   Yvonne Ayala is a 20 y.o. G33P1001 female at [redacted]w[redacted]d with an Estimated Date of Delivery: 02/16/21 being seen today for ongoing management of a low-risk pregnancy.  Depression screen [facility] 2/9 08/14/2020 10/16/2017 09/07/2017  Decreased Interest 0 0 0  Down, Depressed, Hopeless 0 0 0  PHQ - 2 Score 0 0 0  Altered sleeping 1 0 0  Tired, decreased energy 0 0 0  Change in appetite 1 0 0  Feeling bad or failure about yourself  0 0 0  Trouble concentrating 0 0 0  Moving slowly or fidgety/restless 0 0 0  Suicidal thoughts 0 0 0  PHQ-9 Score 2 0 0    Today she reports no complaints. Contractions: Not present. Vag. Bleeding: None.  Movement: Present. denies leaking of fluid. Review of Systems:   Pertinent items are noted in HPI Denies abnormal vaginal discharge w/ itching/odor/irritation, headaches, visual changes, shortness of breath, chest pain, abdominal pain, severe nausea/vomiting, or problems with urination or bowel movements unless otherwise stated above. Pertinent History Reviewed:  Reviewed past medical,surgical, social, obstetrical and family history.  Reviewed problem list, medications and allergies. Physical Assessment:   Vitals:   12/14/20 0915  BP: 117/76  Pulse: 99  Weight: 260 lb (117.9 kg)  Body mass index is 39.53 kg/m.        Physical Examination:   General appearance: Well appearing, and in no distress  Mental status: Alert, oriented to person, place, and time  Skin: Warm & dry  Cardiovascular: Normal heart rate noted  Respiratory: Normal respiratory effort, no distress  Abdomen: Soft, gravid, nontender  Pelvic: Cervical exam deferred         Extremities: Edema: None  Fetal Status: Fetal Heart Rate (bpm): 138 Fundal Height: 31 cm Movement: Present    Chaperone: N/A   No results found for  this or any previous visit (from the past 24 hour(s)).  Assessment & Plan:  1) Low-risk pregnancy G2P1001 at [redacted]w[redacted]d with an Estimated Date of Delivery: 02/16/21   2) Fetal mild bilateral RPD , repeat u/s next visit   Meds: No orders of the defined types were placed in this encounter.  Labs/procedures today: none  Plan:  Continue routine obstetrical care  Next visit: prefers will be in person for u/s    Reviewed: Preterm labor symptoms and general obstetric precautions including but not limited to vaginal bleeding, contractions, leaking of fluid and fetal movement were reviewed in detail with the patient.  All questions were answered. Has home bp cuff. Check bp weekly, let us know if >140/90.   Follow-up: Return in about 2 weeks (around 12/28/2020) for LROB, US:OB F/U kidneys, in person, CNM.  No future appointments.  Orders Placed This Encounter  Procedures  . US OB Follow Up   Cheral Marker CNM, [facility] 12/14/2020 9:43 AM

## 2020-12-15 ENCOUNTER — Other Ambulatory Visit: Payer: Self-pay

## 2020-12-15 ENCOUNTER — Inpatient Hospital Stay (HOSPITAL_COMMUNITY)
Admission: AD | Admit: 2020-12-15 | Discharge: 2020-12-15 | Disposition: A | Discharge status: Home / Self Care | Payer: 59 | Attending: Obstetrics and Gynecology | Admitting: Obstetrics and Gynecology

## 2020-12-15 ENCOUNTER — Encounter (HOSPITAL_COMMUNITY): Payer: Self-pay | Admitting: Obstetrics and Gynecology

## 2020-12-15 DIAGNOSIS — N898 Other specified noninflammatory disorders of vagina: Secondary | ICD-10-CM

## 2020-12-15 DIAGNOSIS — O26893 Other specified pregnancy related conditions, third trimester: Secondary | ICD-10-CM | POA: Diagnosis not present

## 2020-12-15 DIAGNOSIS — Z3480 Encounter for supervision of other normal pregnancy, unspecified trimester: Secondary | ICD-10-CM

## 2020-12-15 DIAGNOSIS — Z3A31 31 weeks gestation of pregnancy: Secondary | ICD-10-CM | POA: Diagnosis not present

## 2020-12-15 DIAGNOSIS — Z3689 Encounter for other specified antenatal screening: Secondary | ICD-10-CM | POA: Insufficient documentation

## 2020-12-15 DIAGNOSIS — Z881 Allergy status to other antibiotic agents status: Secondary | ICD-10-CM | POA: Diagnosis not present

## 2020-12-15 DIAGNOSIS — O35EXX0 Maternal care for other (suspected) fetal abnormality and damage, fetal genitourinary anomalies, not applicable or unspecified: Secondary | ICD-10-CM

## 2020-12-15 DIAGNOSIS — O358XX0 Maternal care for other (suspected) fetal abnormality and damage, not applicable or unspecified: Secondary | ICD-10-CM

## 2020-12-15 LAB — URINALYSIS, ROUTINE W REFLEX MICROSCOPIC
Bilirubin Urine: NEGATIVE
Glucose, UA: NEGATIVE mg/dL
Hgb urine dipstick: NEGATIVE
Ketones, ur: NEGATIVE mg/dL
Nitrite: NEGATIVE
Protein, ur: NEGATIVE mg/dL
Specific Gravity, Urine: 1.006 (ref 1.005–1.030)
pH: 7 (ref 5.0–8.0)

## 2020-12-15 LAB — WET PREP, GENITAL
Clue Cells Wet Prep HPF POC: NONE SEEN
Sperm: NONE SEEN
Trich, Wet Prep: NONE SEEN
Yeast Wet Prep HPF POC: NONE SEEN

## 2020-12-15 NOTE — Discharge Instructions (Signed)
Braxton Hicks Contractions  Contractions of the uterus can occur throughout pregnancy, but they are not always a sign that you are in labor. You may have practice contractions called Braxton Hicks contractions. These false labor contractions are sometimes confused with true labor.  What are Braxton Hicks contractions?  Braxton Hicks contractions are tightening movements that occur in the muscles of the uterus before labor. Unlike true labor contractions, these contractions do not result in opening (dilation) and thinning of the cervix. Toward the end of pregnancy (32-34 weeks), Braxton Hicks contractions can happen more often and may become stronger. These contractions are sometimes difficult to tell apart from true labor because they can be very uncomfortable. You should not feel embarrassed if you go to the hospital with false labor.  Sometimes, the only way to tell if you are in true labor is for your health care provider to look for changes in the cervix. The health care provider will do a physical exam and may monitor your contractions. If you are not in true labor, the exam should show that your cervix is not dilating and your water has not broken.  If there are no other health problems associated with your pregnancy, it is completely safe for you to be sent home with false labor. You may continue to have Braxton Hicks contractions until you go into true labor.  How to tell the difference between true labor and false labor  True labor  · Contractions last 30-70 seconds.  · Contractions become very regular.  · Discomfort is usually felt in the top of the uterus, and it spreads to the lower abdomen and low back.  · Contractions do not go away with walking.  · Contractions usually become more intense and increase in frequency.  · The cervix dilates and gets thinner.  False labor  · Contractions are usually shorter and not as strong as true labor contractions.  · Contractions are usually irregular.  · Contractions  are often felt in the front of the lower abdomen and in the groin.  · Contractions may go away when you walk around or change positions while lying down.  · Contractions get weaker and are shorter-lasting as time goes on.  · The cervix usually does not dilate or become thin.  Follow these instructions at home:    · Take over-the-counter and prescription medicines only as told by your health care provider.  · Keep up with your usual exercises and follow other instructions from your health care provider.  · Eat and drink lightly if you think you are going into labor.  · If Braxton Hicks contractions are making you uncomfortable:  ? Change your position from lying down or resting to walking, or change from walking to resting.  ? Sit and rest in a tub of warm water.  ? Drink enough fluid to keep your urine pale yellow. Dehydration may cause these contractions.  ? Do slow and deep breathing several times an hour.  · Keep all follow-up prenatal visits as told by your health care provider. This is important.  Contact a health care provider if:  · You have a fever.  · You have continuous pain in your abdomen.  Get help right away if:  · Your contractions become stronger, more regular, and closer together.  · You have fluid leaking or gushing from your vagina.  · You pass blood-tinged mucus (bloody show).  · You have bleeding from your vagina.  · You have low back   pain that you never had before.  · You feel your baby’s head pushing down and causing pelvic pressure.  · Your baby is not moving inside you as much as it used to.  Summary  · Contractions that occur before labor are called Braxton Hicks contractions, false labor, or practice contractions.  · Braxton Hicks contractions are usually shorter, weaker, farther apart, and less regular than true labor contractions. True labor contractions usually become progressively stronger and regular, and they become more frequent.  · Manage discomfort from Braxton Hicks contractions  by changing position, resting in a warm bath, drinking plenty of water, or practicing deep breathing.  This information is not intended to replace advice given to you by your health care provider. Make sure you discuss any questions you have with your health care provider.  Document Revised: 09/01/2017 Document Reviewed: 02/02/2017  Elsevier Patient Education © 2021 Elsevier Inc.

## 2020-12-15 NOTE — MAU Note (Signed)
Presents stating she may have loss her mucus plug today.  Called MD office and instructed to be evalauted @ hospital. Reports lower abdominal crampng.   Denies VB or LOF.  Endorses +FM.

## 2020-12-15 NOTE — MAU Provider Note (Signed)
History     CSN: [id number]  Arrival date and time: 12/15/20 1416   Event Date/Time   First Provider Initiated Contact with Patient 12/15/20 1521      Chief Complaint  Patient presents with  . Loss Mucus Plug   20 y.o. G2P1001 @31 .0 wks presenting with passing mucus plug. Reports seeing it when she wiped twice today. Denies VB and LOF. Denies recent IC. Reports good FM. Also reports intermittent abdominal tightening. Describes as mild and occasional frequency.    OB History    Gravida  2   Para  1   Term  1   Preterm      AB      Living  1     SAB      IAB      Ectopic      Multiple  0   Live Births  1           Past Medical History:  Diagnosis Date  . Asthma    as a child  . MRSA infection    at age 66 and hospitalized    Past Surgical History:  Procedure Laterality Date  . WISDOM TOOTH EXTRACTION      Family History  Adopted: Yes    Social History   Tobacco Use  . Smoking status: Never Smoker  . Smokeless tobacco: Never Used  Vaping Use  . Vaping Use: Former  . Devices: 52months prior to preg  Substance Use Topics  . Alcohol use: Not Currently  . Drug use: No    Allergies:  Allergies  Allergen Reactions  . Vancomycin Hives and Itching    Medications Prior to Admission  Medication Sig Dispense Refill Last Dose  . prenatal vitamin w/FE, FA (PRENATAL 1 + 1) 27-1 MG TABS tablet Take 1 tablet by mouth daily at 12 noon. 30 tablet 11 12/15/2020 at Unknown time  . Doxylamine-Pyridoxine (DICLEGIS) 10-10 MG TBEC 2 tabs q hs, if sx persist add 1 tab q am on day 3, if sx persist add 1 tab q afternoon on day 4 (Patient not taking: No sig reported) 100 tablet 6     Review of Systems  Gastrointestinal: Negative for abdominal pain.  Genitourinary: Positive for vaginal discharge. Negative for vaginal bleeding.   Physical Exam   Blood pressure 112/66, pulse 92, temperature 98.1 F (36.7 C), temperature source Oral, resp. rate 18, height 5'  8.5" (1.74 m), weight 119 kg, last menstrual period 05/12/2020, SpO2 100 %.  Physical Exam Vitals and nursing note reviewed. Exam conducted with a chaperone present.  Constitutional:      Appearance: Normal appearance.  HENT:     Head: Normocephalic and atraumatic.  Pulmonary:     Effort: Pulmonary effort is normal. No respiratory distress.  Genitourinary:    Comments: External: no lesions or erythema Vagina: rugated, pink, moist, scant white mucous discharge Cervix closed/thick  Musculoskeletal:        General: Normal range of motion.  Skin:    General: Skin is warm and dry.  Neurological:     General: No focal deficit present.     Mental Status: She is alert and oriented to person, place, and time.  Psychiatric:        Mood and Affect: Mood normal.        Behavior: Behavior normal.   EFM: 135 bpm, mod variability, + accels, no decels Toco: none  Results for orders placed or performed during the hospital encounter of 12/15/20 (  from the past 24 hour(s))  Urinalysis, Routine w reflex microscopic Urine, Clean Catch     Status: Abnormal   Collection Time: 12/15/20  3:31 PM  Result Value Ref Range   Color, Urine YELLOW YELLOW   APPearance HAZY (A) CLEAR   Specific Gravity, Urine 1.006 1.005 - 1.030   pH 7.0 5.0 - 8.0   Glucose, UA NEGATIVE NEGATIVE mg/dL   Hgb urine dipstick NEGATIVE NEGATIVE   Bilirubin Urine NEGATIVE NEGATIVE   Ketones, ur NEGATIVE NEGATIVE mg/dL   Protein, ur NEGATIVE NEGATIVE mg/dL   Nitrite NEGATIVE NEGATIVE   Leukocytes,Ua SMALL (A) NEGATIVE   RBC / HPF 0-5 0 - 5 RBC/hpf   WBC, UA 6-10 0 - 5 WBC/hpf   Bacteria, UA FEW (A) NONE SEEN   Squamous Epithelial / LPF 11-20 0 - 5   Mucus PRESENT   Wet prep, genital     Status: Abnormal   Collection Time: 12/15/20  3:31 PM   Specimen: PATH Cytology Cervicovaginal Ancillary Only  Result Value Ref Range   Yeast Wet Prep HPF POC NONE SEEN NONE SEEN   Trich, Wet Prep NONE SEEN NONE SEEN   Clue Cells Wet  Prep HPF POC NONE SEEN NONE SEEN   WBC, Wet Prep HPF POC FEW (A) NONE SEEN   Sperm NONE SEEN    MAU Course  Procedures  MDM Labs ordered and reviewed. No signs of PTL. Stable for discharge home.   Assessment and Plan  [redacted] weeks gestation Reactive NST Vaginal discharge Discharge home Follow up at [facility] as scheduled PTL precautions  Allergies as of 12/15/2020      Reactions   Vancomycin Hives, Itching      Medication List    STOP taking these medications   Doxylamine-Pyridoxine 10-10 MG Tbec Commonly known as: Diclegis     TAKE these medications   prenatal vitamin w/FE, FA 27-1 MG Tabs tablet Take 1 tablet by mouth daily at 12 noon.      Donette Larry, CNM 12/15/2020, 5:11 PM

## 2020-12-16 LAB — GC/CHLAMYDIA PROBE AMP (~~LOC~~) NOT AT ARMC
Chlamydia: NEGATIVE
Comment: NEGATIVE
Comment: NORMAL
Neisseria Gonorrhea: NEGATIVE

## 2020-12-29 ENCOUNTER — Encounter: Payer: Self-pay | Admitting: Women's Health

## 2020-12-29 ENCOUNTER — Ambulatory Visit (INDEPENDENT_AMBULATORY_CARE_PROVIDER_SITE_OTHER): Payer: 59 | Admitting: Women's Health

## 2020-12-29 ENCOUNTER — Other Ambulatory Visit: Payer: Self-pay

## 2020-12-29 ENCOUNTER — Ambulatory Visit (INDEPENDENT_AMBULATORY_CARE_PROVIDER_SITE_OTHER): Payer: 59

## 2020-12-29 VITALS — BP 114/75 | HR 92 | Wt 262.6 lb

## 2020-12-29 DIAGNOSIS — Z3480 Encounter for supervision of other normal pregnancy, unspecified trimester: Secondary | ICD-10-CM

## 2020-12-29 DIAGNOSIS — O35EXX0 Maternal care for other (suspected) fetal abnormality and damage, fetal genitourinary anomalies, not applicable or unspecified: Secondary | ICD-10-CM

## 2020-12-29 DIAGNOSIS — O358XX0 Maternal care for other (suspected) fetal abnormality and damage, not applicable or unspecified: Secondary | ICD-10-CM | POA: Diagnosis not present

## 2020-12-29 DIAGNOSIS — Z3483 Encounter for supervision of other normal pregnancy, third trimester: Secondary | ICD-10-CM | POA: Diagnosis not present

## 2020-12-29 DIAGNOSIS — Z3A33 33 weeks gestation of pregnancy: Secondary | ICD-10-CM

## 2020-12-29 DIAGNOSIS — Z029 Encounter for administrative examinations, unspecified: Secondary | ICD-10-CM

## 2020-12-29 NOTE — Progress Notes (Signed)
[place] 33 wks,cephalic,cx 4.7 cm,anterior placenta gr 2,fhr 137 bpm,normal right renal pelvis,mild left renal pelvic dilatation 7.3 mm,afi 17 cm,EFW 2602 g 94%

## 2020-12-29 NOTE — Patient Instructions (Signed)
Charise Carwin, I greatly value your feedback.  If you receive a survey following your visit with [place] today, we appreciate you taking the time to fill it out.  Thanks, [name], CNM, WHNP-BC  [facility] ([address] [place], [place] [address]) Entrance C, located off of [address] valet parking   Go to [email or web address] to register for FREE online childbirth classes    Call the office [phone]) or go to [facility] if:  You begin to have strong, frequent contractions  Your water breaks.  Sometimes it is a big gush of fluid, sometimes it is just a trickle that keeps getting your panties wet or running down your legs  You have vaginal bleeding.  It is normal to have a small amount of spotting if your cervix was checked.   You don't feel your baby moving like normal.  If you don't, get you something to eat and drink and lay down and focus on feeling your baby move.  You should feel at least 10 movements in 2 hours.  If you don't, you should call the office or go to [facility].   Call the office [phone]) or go to [facility] hospital for these signs of pre-eclampsia:  Severe headache that does not go away with Tylenol  Visual changes- seeing spots, double, blurred vision  Pain under your right breast or upper abdomen that does not go away with Tums or heartburn medicine  Nausea and/or vomiting  Severe swelling in your hands, feet, and face    Home Blood Pressure Monitoring for Patients   Your provider has recommended that you check your blood pressure (BP) at least once a week at home. If you do not have a blood pressure cuff at home, one will be provided for you. Contact your provider if you have not received your monitor within 1 week.   Helpful Tips for Accurate Home Blood Pressure Checks  . Don't smoke, exercise, or drink caffeine 30 minutes before checking your BP . Use the restroom before checking your BP (a full  bladder can raise your pressure) . Relax in a comfortable upright chair . Feet on the ground . Left arm resting comfortably on a flat surface at the level of your heart . Legs uncrossed . Back supported . Sit quietly and don't talk . Place the cuff on your bare arm . Adjust snuggly, so that only two fingertips can fit between your skin and the top of the cuff . Check 2 readings separated by at least one minute . Keep a log of your BP readings . For a visual, please reference this diagram: http://ccnc.care/bpdiagram  Provider Name: [facility]     Phone: [phone]  Zone 1: ALL CLEAR  Continue to monitor your symptoms:  . BP reading is less than 140 (top number) or less than 90 (bottom number)  . No right upper stomach pain . No headaches or seeing spots . No feeling nauseated or throwing up . No swelling in face and hands  Zone 2: CAUTION Call your doctor's office for any of the following:  . BP reading is greater than 140 (top number) or greater than 90 (bottom number)  . Stomach pain under your ribs in the middle or right side . Headaches or seeing spots . Feeling nauseated or throwing up . Swelling in face and hands  Zone 3: EMERGENCY  Seek immediate medical care if you have any of  the following:  . BP reading is greater than160 (top number) or greater than 110 (bottom number) . Severe headaches not improving with Tylenol . Serious difficulty catching your breath . Any worsening symptoms from Zone 2  Preterm Labor and Birth Information  The normal length of a pregnancy is 39-41 weeks. Preterm labor is when labor starts before 37 completed weeks of pregnancy. What are the risk factors for preterm labor? Preterm labor is more likely to occur in women who:  Have certain infections during pregnancy such as a bladder infection, sexually transmitted infection, or infection inside the uterus (chorioamnionitis).  Have a shorter-than-normal cervix.  Have gone into  preterm labor before.  Have had surgery on their cervix.  Are younger than age 54 or older than age 25.  Are African American.  Are pregnant with twins or multiple babies (multiple gestation).  Take street drugs or smoke while pregnant.  Do not gain enough weight while pregnant.  Became pregnant shortly after having been pregnant. What are the symptoms of preterm labor? Symptoms of preterm labor include:  Cramps similar to those that can happen during a menstrual period. The cramps may happen with diarrhea.  Pain in the abdomen or lower back.  Regular uterine contractions that may feel like tightening of the abdomen.  A feeling of increased pressure in the pelvis.  Increased watery or bloody mucus discharge from the vagina.  Water breaking (ruptured amniotic sac). Why is it important to recognize signs of preterm labor? It is important to recognize signs of preterm labor because babies who are born prematurely may not be fully developed. This can put them at an increased risk for:  Long-term (chronic) heart and lung problems.  Difficulty immediately after birth with regulating body systems, including blood sugar, body temperature, heart rate, and breathing rate.  Bleeding in the brain.  Cerebral palsy.  Learning difficulties.  Death. These risks are highest for babies who are born before 48 weeks of pregnancy. How is preterm labor treated? Treatment depends on the length of your pregnancy, your condition, and the health of your baby. It may involve: 1. Having a stitch (suture) placed in your cervix to prevent your cervix from opening too early (cerclage). 2. Taking or being given medicines, such as: ? Hormone medicines. These may be given early in pregnancy to help support the pregnancy. ? Medicine to stop contractions. ? Medicines to help mature the baby's lungs. These may be prescribed if the risk of delivery is high. ? Medicines to prevent your baby from  developing cerebral palsy. If the labor happens before 34 weeks of pregnancy, you may need to stay in the hospital. What should I do if I think I am in preterm labor? If you think that you are going into preterm labor, call your health care provider right away. How can I prevent preterm labor in future pregnancies? To increase your chance of having a full-term pregnancy:  Do not use any tobacco products, such as cigarettes, chewing tobacco, and e-cigarettes. If you need help quitting, ask your health care provider.  Do not use street drugs or medicines that have not been prescribed to you during your pregnancy.  Talk with your health care provider before taking any herbal supplements, even if you have been taking them regularly.  Make sure you gain a healthy amount of weight during your pregnancy.  Watch for infection. If you think that you might have an infection, get it checked right away.  Make sure to  tell your health care provider if you have gone into preterm labor before. This information is not intended to replace advice given to you by your health care provider. Make sure you discuss any questions you have with your health care provider. Document Revised: 01/11/2019 Document Reviewed: 02/10/2016 Elsevier Patient Education  [employer].

## 2020-12-29 NOTE — Progress Notes (Signed)
   LOW-RISK PREGNANCY VISIT Patient name: Yvonne Ayala MRN [id number]  Date of birth: 2001/05/06 Chief Complaint:   Routine Prenatal Visit and Pregnancy Ultrasound  History of Present Illness:   Yvonne Ayala is a 20 y.o. G55P1001 female at [redacted]w[redacted]d with an Estimated Date of Delivery: 02/16/21 being seen today for ongoing management of a low-risk pregnancy.  Depression screen [facility] 2/9 08/14/2020 10/16/2017 09/07/2017  Decreased Interest 0 0 0  Down, Depressed, Hopeless 0 0 0  PHQ - 2 Score 0 0 0  Altered sleeping 1 0 0  Tired, decreased energy 0 0 0  Change in appetite 1 0 0  Feeling bad or failure about yourself  0 0 0  Trouble concentrating 0 0 0  Moving slowly or fidgety/restless 0 0 0  Suicidal thoughts 0 0 0  PHQ-9 Score 2 0 0    Today she reports some lower pelvic pain/pressure. Contractions: Not present. Vag. Bleeding: None.  Movement: Present. denies leaking of fluid. Review of Systems:   Pertinent items are noted in HPI Denies abnormal vaginal discharge w/ itching/odor/irritation, headaches, visual changes, shortness of breath, chest pain, abdominal pain, severe nausea/vomiting, or problems with urination or bowel movements unless otherwise stated above. Pertinent History Reviewed:  Reviewed past medical,surgical, social, obstetrical and family history.  Reviewed problem list, medications and allergies. Physical Assessment:   Vitals:   12/29/20 1612  BP: 114/75  Pulse: 92  Weight: 262 lb 9.6 oz (119.1 kg)  Body mass index is 39.35 kg/m.        Physical Examination:   General appearance: Well appearing, and in no distress  Mental status: Alert, oriented to person, place, and time  Skin: Warm & dry  Cardiovascular: Normal heart rate noted  Respiratory: Normal respiratory effort, no distress  Abdomen: Soft, gravid, nontender  Pelvic: Cervical exam deferred         Extremities: Edema: Trace  Fetal Status: Fetal Heart Rate (bpm): 137 u/s   Movement: Present    [place] 33  wks,cephalic,cx 4.7 cm,anterior placenta gr 2,fhr 137 bpm,normal right renal pelvis,mild left renal pelvic dilatation 7.3 mm,afi 17 cm,EFW 2602 g 94%  Chaperone: N/A   No results found for this or any previous visit (from the past 24 hour(s)).  Assessment & Plan:  1) Low-risk pregnancy G2P1001 at [redacted]w[redacted]d with an Estimated Date of Delivery: 02/16/21   2) Fetal mild Lt RPD, 7.48mm today, discussed w/ LHE, notify peds on admit  3) Suspected LGA> 94% today, will repeat at 37wks, normal GTT   Meds: No orders of the defined types were placed in this encounter.  Labs/procedures today: U/S  Plan:  Continue routine obstetrical care  Next visit: prefers online    Reviewed: Preterm labor symptoms and general obstetric precautions including but not limited to vaginal bleeding, contractions, leaking of fluid and fetal movement were reviewed in detail with the patient.  All questions were answered. Does have home bp cuff. Office bp cuff given: not applicable. Check bp weekly, let us know if consistently >140 and/or >90.  Follow-up: Return in about 2 weeks (around 01/12/2021) for LROB, CNM, MyChart Video.  Future Appointments  Date Time Provider Department Center  01/12/2021  2:10 PM Myna Hidalgo, DO [facility]    No orders of the defined types were placed in this encounter.  Cheral Marker CNM, [facility] 12/29/2020 5:00 PM

## 2020-12-30 ENCOUNTER — Encounter: Payer: Self-pay | Admitting: Women's Health

## 2021-01-12 ENCOUNTER — Telehealth (INDEPENDENT_AMBULATORY_CARE_PROVIDER_SITE_OTHER): Payer: 59 | Admitting: Obstetrics & Gynecology

## 2021-01-12 ENCOUNTER — Other Ambulatory Visit: Payer: Self-pay

## 2021-01-12 ENCOUNTER — Encounter: Payer: Self-pay | Admitting: Obstetrics & Gynecology

## 2021-01-12 VITALS — BP 112/74 | HR 86

## 2021-01-12 DIAGNOSIS — Z3A35 35 weeks gestation of pregnancy: Secondary | ICD-10-CM

## 2021-01-12 DIAGNOSIS — O3663X0 Maternal care for excessive fetal growth, third trimester, not applicable or unspecified: Secondary | ICD-10-CM

## 2021-01-12 DIAGNOSIS — Z3483 Encounter for supervision of other normal pregnancy, third trimester: Secondary | ICD-10-CM

## 2021-01-12 NOTE — Progress Notes (Signed)
TELEHEALTH VIRTUAL OBSTETRICS VISIT ENCOUNTER NOTE Patient name: Yvonne Ayala MRN [id number]  Date of birth: 12-17-2000  I connected with patient on 01/12/21 at  2:10 PM EDT by video and verified that I am speaking with the correct person using two identifiers. Pt is not currently in our office, she is in her car.  The provider is in the office.    I discussed the limitations, risks, security and privacy concerns of performing an evaluation and management service by telephone and the availability of in person appointments. I also discussed with the patient that there may be a patient responsible charge related to this service. The patient expressed understanding and agreed to proceed.  Chief Complaint:   Routine Prenatal Visit  History of Present Illness:   Yvonne Ayala is a 20 y.o. G11P1001 female at [redacted]w[redacted]d with an Estimated Date of Delivery: 02/16/21 being evaluated today for ongoing management of a low-risk pregnancy.   Pregnancy complicated by: -LGA- Last Korea @ 33wks, []  plan to repeat @ 37wks  Depression screen [facility] 2/9 08/14/2020 10/16/2017 09/07/2017  Decreased Interest 0 0 0  Down, Depressed, Hopeless 0 0 0  PHQ - 2 Score 0 0 0  Altered sleeping 1 0 0  Tired, decreased energy 0 0 0  Change in appetite 1 0 0  Feeling bad or failure about yourself  0 0 0  Trouble concentrating 0 0 0  Moving slowly or fidgety/restless 0 0 0  Suicidal thoughts 0 0 0  PHQ-9 Score 2 0 0    Today she reports no complaints. Contractions: Not present. Vag. Bleeding: None.  Movement: Present. denies leaking of fluid. Review of Systems:   Pertinent items are noted in HPI Denies abnormal vaginal discharge w/ itching/odor/irritation, headaches, visual changes, shortness of breath, chest pain, abdominal pain, severe nausea/vomiting, or problems with urination or bowel movements unless otherwise stated above. Pertinent History Reviewed:  Reviewed past medical,surgical, social, obstetrical and family  history.  Reviewed problem list, medications and allergies. Physical Assessment:   Vitals:   01/12/21 1424  BP: 112/74  Pulse: 86  There is no height or weight on file to calculate BMI.        Physical Examination:   General:  Alert, oriented and cooperative.   Mental Status: Normal mood and affect perceived. Normal judgment and thought content.  Rest of physical exam deferred due to type of encounter  No results found for this or any previous visit (from the past 24 hour(s)).  Assessment & Plan:  1) Pregnancy G2P1001 at [redacted]w[redacted]d with an Estimated Date of Delivery: 02/16/21   2) -LGA- Last 22/19/22 @ 33wks, []  plan to repeat @ 37wks   Meds: No orders of the defined types were placed in this encounter.   Labs/procedures today: none, GBS/cultures next visit  Plan:  Continue routine obstetrical care   Does have home bp cuff.  Check bp weekly, let us know if consistently >140 and/or >90.  Next visit: prefers in person    Reviewed: Term labor symptoms and general obstetric precautions including but not limited to vaginal bleeding, contractions, leaking of fluid and fetal movement were reviewed in detail with the patient. The patient was advised to call back or seek an in-person office evaluation/go to [facility] at [facility] for any urgent or concerning symptoms. All questions were answered. Please refer to After Visit Summary for other counseling recommendations.    I provided ~ 10 minutes of non-face-to-face time during this encounter.  Follow-up: Return in about 1 week (around 01/19/2021) for LROB visit and [place] growth to be scheduled in 2 weeks.  No orders of the defined types were placed in this encounter.  Myna Hidalgo, DO Attending Obstetrician & Gynecologist, [facility] for [employer], [facility]

## 2021-01-18 ENCOUNTER — Other Ambulatory Visit: Payer: Self-pay | Admitting: Obstetrics & Gynecology

## 2021-01-19 ENCOUNTER — Encounter: Payer: Self-pay | Admitting: Obstetrics & Gynecology

## 2021-01-19 ENCOUNTER — Ambulatory Visit (INDEPENDENT_AMBULATORY_CARE_PROVIDER_SITE_OTHER): Payer: 59

## 2021-01-19 ENCOUNTER — Ambulatory Visit (INDEPENDENT_AMBULATORY_CARE_PROVIDER_SITE_OTHER): Payer: 59 | Admitting: Obstetrics & Gynecology

## 2021-01-19 ENCOUNTER — Other Ambulatory Visit: Payer: Self-pay

## 2021-01-19 VITALS — BP 109/72 | HR 92 | Wt 265.5 lb

## 2021-01-19 DIAGNOSIS — Z3490 Encounter for supervision of normal pregnancy, unspecified, unspecified trimester: Secondary | ICD-10-CM

## 2021-01-19 DIAGNOSIS — O358XX0 Maternal care for other (suspected) fetal abnormality and damage, not applicable or unspecified: Secondary | ICD-10-CM

## 2021-01-19 DIAGNOSIS — Z3A36 36 weeks gestation of pregnancy: Secondary | ICD-10-CM

## 2021-01-19 DIAGNOSIS — Z3480 Encounter for supervision of other normal pregnancy, unspecified trimester: Secondary | ICD-10-CM

## 2021-01-19 DIAGNOSIS — O35EXX0 Maternal care for other (suspected) fetal abnormality and damage, fetal genitourinary anomalies, not applicable or unspecified: Secondary | ICD-10-CM

## 2021-01-19 NOTE — Progress Notes (Signed)
   LOW-RISK PREGNANCY VISIT Patient name: Yvonne Ayala MRN [id number]  Date of birth: January 19, 2001 Chief Complaint:   Routine Prenatal Visit (Korea & GBS today)  History of Present Illness:   Yvonne Ayala is a 20 y.o. G104P1001 female at [redacted]w[redacted]d with an Estimated Date of Delivery: 02/16/21 being seen today for ongoing management of a low-risk pregnancy.  Depression screen [facility] 2/9 08/14/2020 10/16/2017 09/07/2017  Decreased Interest 0 0 0  Down, Depressed, Hopeless 0 0 0  PHQ - 2 Score 0 0 0  Altered sleeping 1 0 0  Tired, decreased energy 0 0 0  Change in appetite 1 0 0  Feeling bad or failure about yourself  0 0 0  Trouble concentrating 0 0 0  Moving slowly or fidgety/restless 0 0 0  Suicidal thoughts 0 0 0  PHQ-9 Score 2 0 0    Today she reports no complaints. Contractions: Not present. Vag. Bleeding: None.  Movement: Present. denies leaking of fluid. Review of Systems:   Pertinent items are noted in HPI Denies abnormal vaginal discharge w/ itching/odor/irritation, headaches, visual changes, shortness of breath, chest pain, abdominal pain, severe nausea/vomiting, or problems with urination or bowel movements unless otherwise stated above. Pertinent History Reviewed:  Reviewed past medical,surgical, social, obstetrical and family history.  Reviewed problem list, medications and allergies. Physical Assessment:   Vitals:   01/19/21 1649  BP: 109/72  Pulse: 92  Weight: 265 lb 8 oz (120.4 kg)  Body mass index is 39.78 kg/m.        Physical Examination:   General appearance: Well appearing, and in no distress  Mental status: Alert, oriented to person, place, and time  Skin: Warm & dry  Cardiovascular: Normal heart rate noted  Respiratory: Normal respiratory effort, no distress  Abdomen: Soft, gravid, nontender  Pelvic: Cervical exam performed         Extremities: Edema: Trace  Fetal Status:     Movement: Present    Chaperone: Malachy Mood    No results found for this or any  previous visit (from the past 24 hour(s)).  Assessment & Plan:  1) Low-risk pregnancy G2P1001 at [redacted]w[redacted]d with an Estimated Date of Delivery: 02/16/21   2) EFW 91%,    Meds: No orders of the defined types were placed in this encounter.  Labs/procedures today:   Plan:  Continue routine obstetrical care  Next visit: prefers in person    Reviewed: Term labor symptoms and general obstetric precautions including but not limited to vaginal bleeding, contractions, leaking of fluid and fetal movement were reviewed in detail with the patient.  All questions were answered. Has home bp cuff. Rx faxed to . Check bp weekly, let us know if >140/90.   Follow-up: No follow-ups on file.  Orders Placed This Encounter  Procedures  . Strep Gp B NAA    Lazaro Arms, MD 01/19/2021 5:04 PM

## 2021-01-19 NOTE — Progress Notes (Signed)
Korea 36 wks,cephalic,cx 4 cm,anterior placenta gr 3,FHR 124 bpm,afi 13.6 cm,EFW 3310 g 91%,AC 98%,BPD 93%,mild bilat renal pelvic dilation,RK 7.6 mm,LK 7.5 mm

## 2021-01-20 ENCOUNTER — Telehealth: Payer: Self-pay | Admitting: Obstetrics & Gynecology

## 2021-01-20 DIAGNOSIS — Z3480 Encounter for supervision of other normal pregnancy, unspecified trimester: Secondary | ICD-10-CM | POA: Diagnosis not present

## 2021-01-20 DIAGNOSIS — Z3A36 36 weeks gestation of pregnancy: Secondary | ICD-10-CM | POA: Diagnosis not present

## 2021-01-20 NOTE — Telephone Encounter (Signed)
Patient wants to know if she could have a doctor's note for 01/19/21 appt sent to her [employer] for work. Clinical staff will follow up with patient.

## 2021-01-20 NOTE — Telephone Encounter (Signed)
Work note for yesterday's appt sent to [employer]. JSY

## 2021-01-22 ENCOUNTER — Encounter: Payer: 59 | Admitting: Obstetrics & Gynecology

## 2021-01-22 LAB — STREP GP B NAA: Strep Gp B NAA: NEGATIVE

## 2021-01-26 ENCOUNTER — Encounter: Payer: 59 | Admitting: Obstetrics & Gynecology

## 2021-01-29 ENCOUNTER — Ambulatory Visit (INDEPENDENT_AMBULATORY_CARE_PROVIDER_SITE_OTHER): Payer: 59 | Admitting: Obstetrics & Gynecology

## 2021-01-29 ENCOUNTER — Encounter: Payer: Self-pay | Admitting: Obstetrics & Gynecology

## 2021-01-29 ENCOUNTER — Other Ambulatory Visit: Payer: Self-pay

## 2021-01-29 VITALS — BP 117/76 | HR 93 | Wt 268.0 lb

## 2021-01-29 DIAGNOSIS — Z3480 Encounter for supervision of other normal pregnancy, unspecified trimester: Secondary | ICD-10-CM

## 2021-01-29 NOTE — Progress Notes (Signed)
   LOW-RISK PREGNANCY VISIT Patient name: Yvonne Ayala MRN [id number]  Date of birth: 23-Jan-2001 Chief Complaint:   Routine Prenatal Visit  History of Present Illness:   Yvonne Ayala is a 20 y.o. G34P1001 female at [redacted]w[redacted]d with an Estimated Date of Delivery: 02/16/21 being seen today for ongoing management of a low-risk pregnancy.  Depression screen [facility] 2/9 08/14/2020 10/16/2017 09/07/2017  Decreased Interest 0 0 0  Down, Depressed, Hopeless 0 0 0  PHQ - 2 Score 0 0 0  Altered sleeping 1 0 0  Tired, decreased energy 0 0 0  Change in appetite 1 0 0  Feeling bad or failure about yourself  0 0 0  Trouble concentrating 0 0 0  Moving slowly or fidgety/restless 0 0 0  Suicidal thoughts 0 0 0  PHQ-9 Score 2 0 0    Today she reports backache and pelvic pressure. Contractions: Not present. Vag. Bleeding: None.  Movement: Present. denies leaking of fluid. Review of Systems:   Pertinent items are noted in HPI Denies abnormal vaginal discharge w/ itching/odor/irritation, headaches, visual changes, shortness of breath, chest pain, abdominal pain, severe nausea/vomiting, or problems with urination or bowel movements unless otherwise stated above. Pertinent History Reviewed:  Reviewed past medical,surgical, social, obstetrical and family history.  Reviewed problem list, medications and allergies. Physical Assessment:   Vitals:   01/29/21 1045  BP: 117/76  Pulse: 93  Weight: 268 lb (121.6 kg)  Body mass index is 40.16 kg/m.        Physical Examination:   General appearance: Well appearing, and in no distress  Mental status: Alert, oriented to person, place, and time  Skin: Warm & dry  Cardiovascular: Normal heart rate noted  Respiratory: Normal respiratory effort, no distress  Abdomen: Soft, gravid, nontender  Pelvic: Cervical exam performed         Extremities: Edema: Mild pitting, slight indentation  Fetal Status:     Movement: Present    Chaperone: Amanda Rash    No results  found for this or any previous visit (from the past 24 hour(s)).  Assessment & Plan:  1) Low-risk pregnancy G2P1001 at [redacted]w[redacted]d with an Estimated Date of Delivery: 02/16/21   2) EFW 91%,    Meds: No orders of the defined types were placed in this encounter.  Labs/procedures today:   Plan:  Continue routine obstetrical care  Next visit: prefers in person    Reviewed: Term labor symptoms and general obstetric precautions including but not limited to vaginal bleeding, contractions, leaking of fluid and fetal movement were reviewed in detail with the patient.  All questions were answered. has home bp cuff. Rx faxed to . Check bp weekly, let us know if >140/90.   Follow-up: No follow-ups on file.  No orders of the defined types were placed in this encounter.   Lazaro Arms, MD 01/29/2021 11:19 AM

## 2021-02-04 ENCOUNTER — Encounter: Payer: Self-pay | Admitting: Advanced Practice Midwife

## 2021-02-04 ENCOUNTER — Ambulatory Visit (INDEPENDENT_AMBULATORY_CARE_PROVIDER_SITE_OTHER): Payer: 59 | Admitting: Advanced Practice Midwife

## 2021-02-04 ENCOUNTER — Other Ambulatory Visit: Payer: Self-pay

## 2021-02-04 ENCOUNTER — Telehealth: Payer: Self-pay | Admitting: Advanced Practice Midwife

## 2021-02-04 ENCOUNTER — Encounter: Payer: Self-pay | Admitting: *Deleted

## 2021-02-04 VITALS — BP 133/81 | HR 97 | Wt 269.0 lb

## 2021-02-04 DIAGNOSIS — Z3A38 38 weeks gestation of pregnancy: Secondary | ICD-10-CM

## 2021-02-04 DIAGNOSIS — Z3483 Encounter for supervision of other normal pregnancy, third trimester: Secondary | ICD-10-CM

## 2021-02-04 NOTE — Patient Instructions (Signed)

## 2021-02-04 NOTE — Telephone Encounter (Signed)
Patient want to know if she can get a doctor's note( leave of absence) from work..dating 02/04/2021 until time of delivery. Clinical staff will follow up with patient.

## 2021-02-04 NOTE — Progress Notes (Signed)
Letter to start maternity leave printed

## 2021-02-04 NOTE — Telephone Encounter (Signed)
Letter written and sent to pt electronically and printed to be left up front

## 2021-02-04 NOTE — Progress Notes (Signed)
   LOW-RISK PREGNANCY VISIT Patient name: Yvonne Ayala MRN [id number]  Date of birth: 23-Jan-2001 Chief Complaint:   Routine Prenatal Visit  History of Present Illness:   Yvonne Ayala is a 20 y.o. G65P1001 female at [redacted]w[redacted]d with an Estimated Date of Delivery: 02/16/21 being seen today for ongoing management of a low-risk pregnancy.  Today she reports general late pregnancy complaints.. Contractions: Not present. Vag. Bleeding: None.  Movement: Present. denies leaking of fluid. Review of Systems:   Pertinent items are noted in HPI Denies abnormal vaginal discharge w/ itching/odor/irritation, headaches, visual changes, shortness of breath, chest pain, abdominal pain, severe nausea/vomiting, or problems with urination or bowel movements unless otherwise stated above. Pertinent History Reviewed:  Reviewed past medical,surgical, social, obstetrical and family history.  Reviewed problem list, medications and allergies. Physical Assessment:   Vitals:   02/04/21 1156  BP: 133/81  Pulse: 97  Weight: 269 lb (122 kg)  Body mass index is 40.31 kg/m.        Physical Examination:   General appearance: Well appearing, and in no distress  Mental status: Alert, oriented to person, place, and time  Skin: Warm & dry  Cardiovascular: Normal heart rate noted  Respiratory: Normal respiratory effort, no distress  Abdomen: Soft, gravid, nontender  Pelvic: Cervical exam performed  Dilation: 1.5 Effacement (%): Thick Station: -3  Extremities: Edema: Mild pitting, slight indentation  Fetal Status: Fetal Heart Rate (bpm): 140 Fundal Height: 40 cm Movement: Present Presentation: Vertex  Chaperone: Angel Neas    No results found for this or any previous visit (from the past 24 hour(s)).  Assessment & Plan:  1) Low-risk pregnancy G2P1001 at [redacted]w[redacted]d with an Estimated Date of Delivery: 02/16/21      Meds: No orders of the defined types were placed in this encounter.   Plan:  Continue routine obstetrical  care  Next visit: prefers will be in person for membrane stripping (aware that it's not often successful w/a thick cx--ripening info given)    Reviewed: Term labor symptoms and general obstetric precautions including but not limited to vaginal bleeding, contractions, leaking of fluid and fetal movement were reviewed in detail with the patient.  All questions were answered. Has home bp cuff.. Check bp weekly, let us know if >140/90.   Follow-up: Return in about 5 days (around 02/09/2021) for LROB.  No orders of the defined types were placed in this encounter.  Jacklyn Shell DNP, CNM 02/04/2021 1:42 PM

## 2021-02-05 ENCOUNTER — Encounter: Payer: 59 | Admitting: Obstetrics & Gynecology

## 2021-02-09 ENCOUNTER — Other Ambulatory Visit: Payer: Self-pay

## 2021-02-09 ENCOUNTER — Ambulatory Visit (INDEPENDENT_AMBULATORY_CARE_PROVIDER_SITE_OTHER): Payer: 59 | Admitting: Obstetrics & Gynecology

## 2021-02-09 ENCOUNTER — Encounter: Payer: Self-pay | Admitting: Obstetrics & Gynecology

## 2021-02-09 VITALS — BP 115/79 | HR 100 | Wt 271.0 lb

## 2021-02-09 DIAGNOSIS — Z3480 Encounter for supervision of other normal pregnancy, unspecified trimester: Secondary | ICD-10-CM

## 2021-02-09 DIAGNOSIS — Z3A39 39 weeks gestation of pregnancy: Secondary | ICD-10-CM

## 2021-02-09 NOTE — Progress Notes (Signed)
   LOW-RISK PREGNANCY VISIT Patient name: Yvonne Ayala MRN [id number]  Date of birth: August 11, 2001 Chief Complaint:   Routine Prenatal Visit  History of Present Illness:   Yvonne Ayala is a 20 y.o. G57P1001 female at [redacted]w[redacted]d with an Estimated Date of Delivery: 02/16/21 being seen today for ongoing management of a low-risk pregnancy.  Depression screen [facility] 2/9 08/14/2020 10/16/2017 09/07/2017  Decreased Interest 0 0 0  Down, Depressed, Hopeless 0 0 0  PHQ - 2 Score 0 0 0  Altered sleeping 1 0 0  Tired, decreased energy 0 0 0  Change in appetite 1 0 0  Feeling bad or failure about yourself  0 0 0  Trouble concentrating 0 0 0  Moving slowly or fidgety/restless 0 0 0  Suicidal thoughts 0 0 0  PHQ-9 Score 2 0 0    Today she reports no complaints. Contractions: Irregular. Vag. Bleeding: None.  Movement: Present. denies leaking of fluid. Review of Systems:   Pertinent items are noted in HPI Denies abnormal vaginal discharge w/ itching/odor/irritation, headaches, visual changes, shortness of breath, chest pain, abdominal pain, severe nausea/vomiting, or problems with urination or bowel movements unless otherwise stated above. Pertinent History Reviewed:  Reviewed past medical,surgical, social, obstetrical and family history.  Reviewed problem list, medications and allergies. Physical Assessment:   Vitals:   02/09/21 1620  BP: 115/79  Pulse: 100  Weight: 271 lb (122.9 kg)  Body mass index is 40.61 kg/m.        Physical Examination:   General appearance: Well appearing, and in no distress  Mental status: Alert, oriented to person, place, and time  Skin: Warm & dry  Cardiovascular: Normal heart rate noted  Respiratory: Normal respiratory effort, no distress  Abdomen: Soft, gravid, nontender  Pelvic: Cervical exam performed         Extremities: Edema: Trace  Fetal Status:     Movement: Present    Chaperone: Malachy Mood    No results found for this or any previous visit (from the  past 24 hour(s)).  Assessment & Plan:  1) Low-risk pregnancy G2P1001 at [redacted]w[redacted]d with an Estimated Date of Delivery: 02/16/21   2) borderline LGA,    Meds: No orders of the defined types were placed in this encounter.  Labs/procedures today:   Plan:  Continue routine obstetrical care  Next visit: prefers in person    Reviewed: Term labor symptoms and general obstetric precautions including but not limited to vaginal bleeding, contractions, leaking of fluid and fetal movement were reviewed in detail with the patient.  All questions were answered. Has home bp cuff. Rx faxed to . Check bp weekly, let us know if >140/90.   Follow-up: Return in about 1 week (around 02/16/2021) for NST.  No orders of the defined types were placed in this encounter.   Lazaro Arms, MD 02/09/2021 4:48 PM

## 2021-02-15 ENCOUNTER — Other Ambulatory Visit: Payer: Self-pay

## 2021-02-15 ENCOUNTER — Encounter: Payer: Self-pay | Admitting: Obstetrics & Gynecology

## 2021-02-15 ENCOUNTER — Ambulatory Visit (INDEPENDENT_AMBULATORY_CARE_PROVIDER_SITE_OTHER): Payer: 59 | Admitting: Obstetrics & Gynecology

## 2021-02-15 VITALS — BP 112/76 | HR 90 | Wt 266.5 lb

## 2021-02-15 DIAGNOSIS — Z3A39 39 weeks gestation of pregnancy: Secondary | ICD-10-CM

## 2021-02-15 DIAGNOSIS — O480 Post-term pregnancy: Secondary | ICD-10-CM

## 2021-02-15 DIAGNOSIS — Z3480 Encounter for supervision of other normal pregnancy, unspecified trimester: Secondary | ICD-10-CM

## 2021-02-15 NOTE — Progress Notes (Signed)
   LOW-RISK PREGNANCY VISIT Patient name: Yvonne Ayala MRN [id number]  Date of birth: [birth year] Chief Complaint:   Routine Prenatal Visit (NST)  History of Present Illness:   Yvonne Ayala is a 20 y.o. G36P1001 female at [redacted]w[redacted]d with an Estimated Date of Delivery: 02/16/21 being seen today for ongoing management of a low-risk pregnancy.  Depression screen [facility] 2/9 08/14/2020 10/16/2017 09/07/2017  Decreased Interest 0 0 0  Down, Depressed, Hopeless 0 0 0  PHQ - 2 Score 0 0 0  Altered sleeping 1 0 0  Tired, decreased energy 0 0 0  Change in appetite 1 0 0  Feeling bad or failure about yourself  0 0 0  Trouble concentrating 0 0 0  Moving slowly or fidgety/restless 0 0 0  Suicidal thoughts 0 0 0  PHQ-9 Score 2 0 0    Today she reports no complaints. Contractions: Irregular. Vag. Bleeding: None.  Movement: Present. denies leaking of fluid. Review of Systems:   Pertinent items are noted in HPI Denies abnormal vaginal discharge w/ itching/odor/irritation, headaches, visual changes, shortness of breath, chest pain, abdominal pain, severe nausea/vomiting, or problems with urination or bowel movements unless otherwise stated above. Pertinent History Reviewed:  Reviewed past medical,surgical, social, obstetrical and family history.  Reviewed problem list, medications and allergies. Physical Assessment:   Vitals:   02/15/21 1508  BP: 112/76  Pulse: 90  Weight: 266 lb 8 oz (120.9 kg)  Body mass index is 39.93 kg/m.        Physical Examination:   General appearance: Well appearing, and in no distress  Mental status: Alert, oriented to person, place, and time  Skin: Warm & dry  Cardiovascular: Normal heart rate noted  Respiratory: Normal respiratory effort, no distress  Abdomen: Soft, gravid, nontender  Pelvic: Cervical exam performed         Extremities: Edema: None  Fetal Status:     Movement: Present    Chaperone: Statistician    No results found for this or any previous  visit (from the past 24 hour(s)).  Assessment & Plan:  1) Low-risk pregnancy G2P1001 at [redacted]w[redacted]d with an Estimated Date of Delivery: 02/16/21   2) pending post dates, reactive NST,   Yvonne Ayala is at [redacted]w[redacted]d Estimated Date of Delivery: 02/16/21  NST being performed due to pending post dates  Today the NST is Reactive  Fetal Monitoring:  Baseline: 140 bpm, Variability: Good {> 6 bpm), Accelerations: Reactive and Decelerations: Absent   reactive  The accelerations are >15 bpm and more than 2 in 20 minutes  Final diagnosis:  Reactive NST  Yvonne Arms, MD      Meds: No orders of the defined types were placed in this encounter.  Labs/procedures today:   Plan:  Continue routine obstetrical care  Membranes swept Next visit: prefers in person    Reviewed: Term labor symptoms and general obstetric precautions including but not limited to vaginal bleeding, contractions, leaking of fluid and fetal movement were reviewed in detail with the patient.  All questions were answered. Has home bp cuff. Rx faxed to . Check bp weekly, let us know if >140/90.   Follow-up: No follow-ups on file.  No orders of the defined types were placed in this encounter.   Yvonne Arms, MD 02/15/2021 4:45 PM

## 2021-02-16 ENCOUNTER — Inpatient Hospital Stay (HOSPITAL_COMMUNITY): Payer: 59 | Admitting: Anesthesiology

## 2021-02-16 ENCOUNTER — Other Ambulatory Visit: Payer: Self-pay

## 2021-02-16 ENCOUNTER — Encounter (HOSPITAL_COMMUNITY): Payer: Self-pay | Admitting: Obstetrics and Gynecology

## 2021-02-16 ENCOUNTER — Inpatient Hospital Stay (HOSPITAL_COMMUNITY)
Admission: AD | Admit: 2021-02-16 | Discharge: 2021-02-17 | DRG: 807 | Disposition: A | Discharge status: Home / Self Care | Payer: 59 | Attending: Obstetrics and Gynecology | Admitting: Obstetrics and Gynecology

## 2021-02-16 DIAGNOSIS — O358XX0 Maternal care for other (suspected) fetal abnormality and damage, not applicable or unspecified: Secondary | ICD-10-CM | POA: Diagnosis not present

## 2021-02-16 DIAGNOSIS — O99214 Obesity complicating childbirth: Secondary | ICD-10-CM | POA: Diagnosis not present

## 2021-02-16 DIAGNOSIS — O9952 Diseases of the respiratory system complicating childbirth: Secondary | ICD-10-CM | POA: Diagnosis not present

## 2021-02-16 DIAGNOSIS — Z8614 Personal history of Methicillin resistant Staphylococcus aureus infection: Secondary | ICD-10-CM | POA: Diagnosis not present

## 2021-02-16 DIAGNOSIS — O26893 Other specified pregnancy related conditions, third trimester: Secondary | ICD-10-CM | POA: Diagnosis present

## 2021-02-16 DIAGNOSIS — Z3A40 40 weeks gestation of pregnancy: Secondary | ICD-10-CM

## 2021-02-16 DIAGNOSIS — Z3480 Encounter for supervision of other normal pregnancy, unspecified trimester: Secondary | ICD-10-CM

## 2021-02-16 DIAGNOSIS — Z20822 Contact with and (suspected) exposure to covid-19: Secondary | ICD-10-CM | POA: Diagnosis not present

## 2021-02-16 DIAGNOSIS — J45909 Unspecified asthma, uncomplicated: Secondary | ICD-10-CM | POA: Diagnosis not present

## 2021-02-16 DIAGNOSIS — O35EXX0 Maternal care for other (suspected) fetal abnormality and damage, fetal genitourinary anomalies, not applicable or unspecified: Secondary | ICD-10-CM

## 2021-02-16 LAB — RESP PANEL BY RT-PCR (FLU A&B, COVID) ARPGX2
Influenza A by PCR: NEGATIVE
Influenza B by PCR: NEGATIVE
SARS Coronavirus 2 by RT PCR: NEGATIVE

## 2021-02-16 LAB — CBC
HCT: 39.9 % (ref 36.0–46.0)
Hemoglobin: 13.3 g/dL (ref 12.0–15.0)
MCH: 28.7 pg (ref 26.0–34.0)
MCHC: 33.3 g/dL (ref 30.0–36.0)
MCV: 86.2 fL (ref 80.0–100.0)
Platelets: 215 10*3/uL (ref 150–400)
RBC: 4.63 MIL/uL (ref 3.87–5.11)
RDW: 14.5 % (ref 11.5–15.5)
WBC: 13 10*3/uL — ABNORMAL HIGH (ref 4.0–10.5)
nRBC: 0 % (ref 0.0–0.2)

## 2021-02-16 LAB — RPR: RPR Ser Ql: NONREACTIVE

## 2021-02-16 LAB — TYPE AND SCREEN
ABO/RH(D): A POS
Antibody Screen: NEGATIVE

## 2021-02-16 MED ORDER — DIPHENHYDRAMINE HCL 50 MG/ML IJ SOLN: 12.5000 mg | INTRAMUSCULAR | Status: DC | PRN

## 2021-02-16 MED ORDER — ONDANSETRON HCL 4 MG PO TABS: 4.0000 mg | ORAL_TABLET | ORAL | Status: DC | PRN

## 2021-02-16 MED ORDER — METHYLERGONOVINE MALEATE 0.2 MG/ML IJ SOLN
0.2000 mg | INTRAMUSCULAR | Status: DC | PRN
Start: 2021-02-16 — End: 2021-02-17

## 2021-02-16 MED ORDER — DIPHENHYDRAMINE HCL 25 MG PO CAPS: 25.0000 mg | ORAL_CAPSULE | Freq: Four times a day (QID) | ORAL | Status: DC | PRN

## 2021-02-16 MED ORDER — SOD CITRATE-CITRIC ACID 500-334 MG/5ML PO SOLN: 30.0000 mL | ORAL | Status: DC | PRN

## 2021-02-16 MED ORDER — OXYTOCIN-SODIUM CHLORIDE 30-0.9 UT/500ML-% IV SOLN
2.5000 [IU]/h | INTRAVENOUS | Status: DC
  Filled 2021-02-16: qty 500

## 2021-02-16 MED ORDER — EPHEDRINE 5 MG/ML INJ: 10.0000 mg | INTRAVENOUS | Status: DC | PRN

## 2021-02-16 MED ORDER — LIDOCAINE HCL (PF) 1 % IJ SOLN
30.0000 mL | INTRAMUSCULAR | Status: DC | PRN
Start: 2021-02-16 — End: 2021-02-16

## 2021-02-16 MED ORDER — BENZOCAINE-MENTHOL 20-0.5 % EX AERO
1.0000 "application " | INHALATION_SPRAY | CUTANEOUS | Status: DC | PRN
  Administered 2021-02-16: 1 via TOPICAL
  Filled 2021-02-16: qty 56

## 2021-02-16 MED ORDER — METHYLERGONOVINE MALEATE 0.2 MG PO TABS: 0.2000 mg | ORAL_TABLET | ORAL | Status: DC | PRN

## 2021-02-16 MED ORDER — OXYCODONE-ACETAMINOPHEN 5-325 MG PO TABS: 1.0000 | ORAL_TABLET | ORAL | Status: DC | PRN

## 2021-02-16 MED ORDER — FENTANYL-BUPIVACAINE-NACL 0.5-0.125-0.9 MG/250ML-% EP SOLN
12.0000 mL/h | EPIDURAL | Status: DC | PRN
Start: 2021-02-16 — End: 2021-02-16
  Administered 2021-02-16: 12 mL/h via EPIDURAL
  Filled 2021-02-16: qty 250

## 2021-02-16 MED ORDER — TETANUS-DIPHTH-ACELL PERTUSSIS 5-2.5-18.5 LF-MCG/0.5 IM SUSY: 0.5000 mL | PREFILLED_SYRINGE | Freq: Once | INTRAMUSCULAR | Status: DC

## 2021-02-16 MED ORDER — LACTATED RINGERS IV SOLN: 500.0000 mL | INTRAVENOUS | Status: DC | PRN

## 2021-02-16 MED ORDER — DIBUCAINE (PERIANAL) 1 % EX OINT: 1.0000 "application " | TOPICAL_OINTMENT | CUTANEOUS | Status: DC | PRN

## 2021-02-16 MED ORDER — MEDROXYPROGESTERONE ACETATE 150 MG/ML IM SUSP: 150.0000 mg | INTRAMUSCULAR | Status: DC | PRN

## 2021-02-16 MED ORDER — PHENYLEPHRINE 40 MCG/ML (10ML) SYRINGE FOR IV PUSH (FOR BLOOD PRESSURE SUPPORT): 80.0000 ug | PREFILLED_SYRINGE | INTRAVENOUS | Status: DC | PRN

## 2021-02-16 MED ORDER — DOCUSATE SODIUM 100 MG PO CAPS
100.0000 mg | ORAL_CAPSULE | Freq: Two times a day (BID) | ORAL | Status: DC
  Administered 2021-02-16 – 2021-02-17 (×2): 100 mg via ORAL
  Filled 2021-02-16 (×2): qty 1

## 2021-02-16 MED ORDER — OXYCODONE-ACETAMINOPHEN 5-325 MG PO TABS: 2.0000 | ORAL_TABLET | ORAL | Status: DC | PRN

## 2021-02-16 MED ORDER — FLEET ENEMA 7-19 GM/118ML RE ENEM: 1.0000 | ENEMA | Freq: Every day | RECTAL | Status: DC | PRN

## 2021-02-16 MED ORDER — SIMETHICONE 80 MG PO CHEW: 80.0000 mg | CHEWABLE_TABLET | ORAL | Status: DC | PRN

## 2021-02-16 MED ORDER — PRENATAL MULTIVITAMIN CH
1.0000 | ORAL_TABLET | Freq: Every day | ORAL | Status: DC
  Administered 2021-02-16 – 2021-02-17 (×2): 1 via ORAL
  Filled 2021-02-16 (×2): qty 1

## 2021-02-16 MED ORDER — ACETAMINOPHEN 325 MG PO TABS
650.0000 mg | ORAL_TABLET | ORAL | Status: DC | PRN
  Administered 2021-02-16 (×2): 650 mg via ORAL
  Filled 2021-02-16 (×2): qty 2

## 2021-02-16 MED ORDER — LIDOCAINE HCL (PF) 1 % IJ SOLN
INTRAMUSCULAR | Status: DC | PRN
  Administered 2021-02-16: 5 mL via EPIDURAL

## 2021-02-16 MED ORDER — ONDANSETRON HCL 4 MG/2ML IJ SOLN: 4.0000 mg | INTRAMUSCULAR | Status: DC | PRN

## 2021-02-16 MED ORDER — LACTATED RINGERS IV SOLN: INTRAVENOUS | Status: DC

## 2021-02-16 MED ORDER — FERROUS SULFATE 325 (65 FE) MG PO TABS
325.0000 mg | ORAL_TABLET | ORAL | Status: DC
  Administered 2021-02-16: 325 mg via ORAL
  Filled 2021-02-16: qty 1

## 2021-02-16 MED ORDER — ACETAMINOPHEN 325 MG PO TABS: 650.0000 mg | ORAL_TABLET | ORAL | Status: DC | PRN

## 2021-02-16 MED ORDER — OXYTOCIN BOLUS FROM INFUSION
333.0000 mL | Freq: Once | INTRAVENOUS | Status: AC
  Administered 2021-02-16: 333 mL via INTRAVENOUS

## 2021-02-16 MED ORDER — BISACODYL 10 MG RE SUPP: 10.0000 mg | Freq: Every day | RECTAL | Status: DC | PRN

## 2021-02-16 MED ORDER — WITCH HAZEL-GLYCERIN EX PADS: 1.0000 "application " | MEDICATED_PAD | CUTANEOUS | Status: DC | PRN

## 2021-02-16 MED ORDER — ONDANSETRON HCL 4 MG/2ML IJ SOLN: 4.0000 mg | Freq: Four times a day (QID) | INTRAMUSCULAR | Status: DC | PRN

## 2021-02-16 MED ORDER — MEASLES, MUMPS & RUBELLA VAC IJ SOLR: 0.5000 mL | Freq: Once | INTRAMUSCULAR | Status: DC

## 2021-02-16 MED ORDER — LACTATED RINGERS IV SOLN: 500.0000 mL | Freq: Once | INTRAVENOUS | Status: DC

## 2021-02-16 MED ORDER — IBUPROFEN 600 MG PO TABS
600.0000 mg | ORAL_TABLET | Freq: Four times a day (QID) | ORAL | Status: DC
  Administered 2021-02-16 – 2021-02-17 (×5): 600 mg via ORAL
  Filled 2021-02-16 (×5): qty 1

## 2021-02-16 MED ORDER — COCONUT OIL OIL
1.0000 "application " | TOPICAL_OIL | Status: DC | PRN
  Administered 2021-02-16: 1 via TOPICAL

## 2021-02-16 NOTE — H&P (Signed)
Yvonne Ayala is a 20 y.o. female G2P1001 with IUP at [redacted]w[redacted]d by early [place] presenting for contractions/labor..  She reports positive fetal movement. She denies leakage of fluid or vaginal bleeding.  Prenatal History/Complications: PNC at [facility] Pregnancy complications:  - Past Medical History: Past Medical History:  Diagnosis Date  . Asthma    as a child  . MRSA infection    at age 62 and hospitalized    Past Surgical History: Past Surgical History:  Procedure Laterality Date  . WISDOM TOOTH EXTRACTION      Obstetrical History: OB History    Gravida  2   Para  1   Term  1   Preterm      AB      Living  1     SAB      IAB      Ectopic      Multiple  0   Live Births  1            Social History: Social History   Socioeconomic History  . Marital status: Single    Spouse name: Not on file  . Number of children: Not on file  . Years of education: Not on file  . Highest education level: Not on file  Occupational History  . Not on file  Tobacco Use  . Smoking status: Never Smoker  . Smokeless tobacco: Never Used  Vaping Use  . Vaping Use: Former  . Devices: 65months prior to preg  Substance and Sexual Activity  . Alcohol use: Not Currently  . Drug use: No  . Sexual activity: Not Currently    Birth control/protection: None  Other Topics Concern  . Not on file  Social History Narrative  . Not on file   Social Determinants of Health   Financial Resource Strain: Low Risk   . Difficulty of Paying Living Expenses: Not hard at all  Food Insecurity: No Food Insecurity  . Worried About Programme researcher, broadcasting/film/video in the Last Year: Never true  . Ran Out of Food in the Last Year: Never true  Transportation Needs: No Transportation Needs  . Lack of Transportation (Medical): No  . Lack of Transportation (Non-Medical): No  Physical Activity: Insufficiently Active  . Days of Exercise per Week: 2 days  . Minutes of Exercise per Session: 10 min  Stress: No  Stress Concern Present  . Feeling of Stress : Only a little  Social Connections: Moderately Integrated  . Frequency of Communication with Friends and Family: Twice a week  . Frequency of Social Gatherings with Friends and Family: Twice a week  . Attends Religious Services: More than 4 times per year  . Active Member of Clubs or Organizations: No  . Attends Banker Meetings: Never  . Marital Status: Married    Family History: Family History  Adopted: Yes    Allergies: Allergies  Allergen Reactions  . Vancomycin Hives and Itching    Medications Prior to Admission  Medication Sig Dispense Refill Last Dose  . prenatal vitamin w/FE, FA (PRENATAL 1 + 1) 27-1 MG TABS tablet Take 1 tablet by mouth daily at 12 noon. 30 tablet 11     Review of Systems   Constitutional: Negative for fever and chills Eyes: Negative for visual disturbances Respiratory: Negative for shortness of breath, dyspnea Cardiovascular: Negative for chest pain or palpitations  Gastrointestinal: Negative for vomiting, diarrhea and constipation.  POSITIVE for abdominal pain (contractions) Genitourinary: Negative for dysuria  and urgency Musculoskeletal: Negative for back pain, joint pain, myalgias  Neurological: Negative for dizziness and headaches  Blood pressure 110/68, pulse 100, temperature 97.9 F (36.6 C), temperature source Oral, resp. rate 18, last menstrual period 05/12/2020, SpO2 100 %. General appearance: alert, cooperative and mild distress Lungs: normal respiratory effort Heart: regular rate and rhythm Abdomen: soft, non-tender; bowel sounds normal Extremities: Homans sign is negative, no sign of DVT DTR's 2+ Presentation: cephalic Fetal monitoring  Baseline: 145 bpm, Variability: Good {> 6 bpm), Accelerations: Reactive and Decelerations: Absent Uterine activity  2 minutes CX:  5.5/100 /-2   Prenatal labs: ABO, Rh: --/--/A POS (05/17 0345) Antibody: NEG (05/17 0345) Rubella:  13.30 (11/12 1218) RPR: Non Reactive (02/25 0849)  HBsAg: Negative (11/12 1218)  HIV: Non Reactive (02/25 0849)  GBS: Negative/-- (04/20 1200)    FAMILY TREE  LAB RESULTS  Language English Pap 11/27/2020 neg  Initiated care at 5wk GC/CT Initial:  -/-          36wks:  Dating by LMP c/w 8wk U/S    Support person  Genetics NT/IT:  neg   AFP:       Panorama:Low Risk Female     Carrier Screen declined  Flu vaccine  [place]/Hgb Elec   TDaP vaccine 11/27/2020    Rhogam n/a Blood Type A/Positive/-- (11/12 1218)    Antibody Negative (11/12 1218)  Anatomy US Girl 'Adalyn', Lt RPD 7.97mm @ 33wks-notify peds on admit HBsAg Negative (11/12 1218)  Feeding Plan breast RPR Non Reactive (11/12 1218)  Contraception POPs Rubella  13.30 (11/12 1218)  Circumcision n/a HIV Non Reactive (11/12 1218)  Pediatrician [facility] in OR Hep C neg  Prenatal Classes declines      A1C/GTT Early:      26-28wks: normal  BTL Consent n/a    VBAC Consent n/a GBS        [ ]  PCN allergy  Waterbirth [ ] Class [ ] Consent [ ] CNM visit       Prenatal Transfer Tool  Maternal Diabetes: No Genetic Screening: Normal Maternal Ultrasounds/Referrals: Fetal Kidney Anomalies Fetal Ultrasounds or other Referrals:  Mild RPD 7.71mm@ 33 weeks Maternal Substance Abuse:  No Significant Maternal Medications:  None Significant Maternal Lab Results: Group B Strep negative  Results for orders placed or performed during the hospital encounter of 02/16/21 (from the past 24 hour(s))  CBC   Collection Time: 02/16/21  3:33 AM  Result Value Ref Range   WBC 13.0 (H) 4.0 - 10.5 K/uL   RBC 4.63 3.87 - 5.11 MIL/uL   Hemoglobin 13.3 12.0 - 15.0 g/dL   HCT 1m - 02/18/21 %   MCV 86.2 80.0 - 100.0 fL   MCH 28.7 26.0 - 34.0 pg   MCHC 33.3 30.0 - 36.0 g/dL   RDW 02/18/21 16.1 - 09.6 %   Platelets 215 150 - 400 K/uL   nRBC 0.0 0.0 - 0.2 %  Type and screen [facility]   Collection Time: 02/16/21  3:45 AM  Result Value Ref Range    ABO/RH(D) A POS    Antibody Screen NEG    Sample Expiration      02/19/2021,2359 Performed at [facility], [address], [place], 02/21/2021 [facility]     Assessment: Yvonne Ayala is a 20 y.o. G2P1001 with an IUP at [redacted]w[redacted]d presenting for active labor  Plan: #Labor: expectant management #Pain:  Per request #FWB Cat 1 #ID: GBS: neg  #MOF:  breast #MOC:  POPs    Jacklyn Shell 02/16/2021, 6:11 AM

## 2021-02-16 NOTE — Anesthesia Procedure Notes (Signed)
Epidural Patient location during procedure: OB Start time: 02/16/2021 4:40 AM End time: 02/16/2021 4:50 AM  Staffing Anesthesiologist: Leonides Grills, MD Performed: anesthesiologist   Preanesthetic Checklist Completed: patient identified, IV checked, site marked, risks and benefits discussed, monitors and equipment checked, pre-op evaluation and timeout performed  Epidural Patient position: sitting Prep: DuraPrep Patient monitoring: heart rate, cardiac monitor, continuous pulse ox and blood pressure Approach: midline Location: L4-L5 Injection technique: LOR air  Needle:  Needle type: Tuohy  Needle gauge: 17 G Needle length: 9 cm Needle insertion depth: 7 cm Catheter type: closed end flexible Catheter size: 19 Gauge Catheter at skin depth: 13 cm Test dose: negative and Other  Assessment Events: blood not aspirated, injection not painful, no injection resistance and negative IV test  Additional Notes Informed consent obtained prior to proceeding including risk of failure, 1% risk of PDPH, risk of minor discomfort and bruising.  Discussed alternatives to epidural analgesia and patient desires to proceed.  Timeout performed pre-procedure verifying patient name, procedure, and platelet count.  Patient tolerated procedure well. Reason for block:procedure for pain

## 2021-02-16 NOTE — Discharge Summary (Signed)
Postpartum Discharge Summary  Date of Service updated      Patient Name: Yvonne Ayala DOB: 03-27-2001 MRN: [id number]  Date of admission: 02/16/2021 Delivery date:02/16/2021  Delivering provider: Christin Fudge  Date of discharge: 02/17/2021  Admitting diagnosis: Indication for care in labor or delivery [O75.9] Intrauterine pregnancy: [redacted]w[redacted]d     Secondary diagnosis:  Active Problems:   Indication for care in labor or delivery  Additional problems: none    Discharge diagnosis: Term Pregnancy Delivered                                              Post partum procedures:n/a Augmentation: none Complications: None  Hospital course: Onset of Labor With Vaginal Delivery      20 y.o. yo G2P1001 at [redacted]w[redacted]d was admitted in Active Labor on 02/16/2021. Patient had an uncomplicated labor course as follows:  Membrane Rupture Time/Date: 5:19 AM ,02/16/2021   Delivery Method:Vaginal, Spontaneous  Episiotomy: None  Lacerations:  None  Patient had an uncomplicated postpartum course.  She is ambulating, tolerating a regular diet, passing flatus, and urinating well. Patient is discharged home in stable condition on 02/17/21.  Newborn Data: Birth date:02/16/2021  Birth time:5:36 AM  Gender:Female  Living status:Living  Apgars:9 ,9  Weight:3824 g   Magnesium Sulfate received: No BMZ received: No Rhophylac:N/A MMR:N/A T-DaP:Given prenatally Flu: No Transfusion:No  Physical exam  Vitals:   02/16/21 1325 02/16/21 1744 02/16/21 2101 02/17/21 0553  BP: 97/67 96/63 110/70 105/74  Pulse: 84 75 76 85  Resp: $Remo'16 16 18 18  'sbzau$ Temp: 98.6 F (37 C) 98.4 F (36.9 C) 98 F (36.7 C) 97.7 F (36.5 C)  TempSrc: Oral Oral Oral Oral  SpO2: 98% 99% 99% 99%   General: alert, cooperative and no distress Lochia: appropriate Uterine Fundus: firm Incision: N/A DVT Evaluation: No evidence of DVT seen on physical exam. Negative Homan's sign. No cords or calf tenderness. No significant  calf/ankle edema. Labs: Lab Results  Component Value Date   WBC 13.0 (H) 02/16/2021   HGB 13.3 02/16/2021   HCT 39.9 02/16/2021   MCV 86.2 02/16/2021   PLT 215 02/16/2021   No flowsheet data found. Edinburgh Score: Edinburgh Postnatal Depression Scale Screening Tool 02/16/2021  I have been able to laugh and see the funny side of things. 0  I have looked forward with enjoyment to things. 0  I have blamed myself unnecessarily when things went wrong. 0  I have been anxious or worried for no good reason. 0  I have felt scared or panicky for no good reason. 0  Things have been getting on top of me. 0  I have been so unhappy that I have had difficulty sleeping. 0  I have felt sad or miserable. 0  I have been so unhappy that I have been crying. 0  The thought of harming myself has occurred to me. 0  Edinburgh Postnatal Depression Scale Total 0     After visit meds:  Allergies as of 02/17/2021      Reactions   Vancomycin Hives, Itching      Medication List    TAKE these medications   ibuprofen 600 MG tablet Commonly known as: ADVIL Take 1 tablet (600 mg total) by mouth every 6 (six) hours as needed for mild pain, moderate pain or cramping.   prenatal vitamin w/FE, FA  27-1 MG Tabs tablet Take 1 tablet by mouth daily at 12 noon.        Discharge home in stable condition Infant Feeding: Breast Infant Disposition:home with mother Discharge instruction: per After Visit Summary and Postpartum booklet. Activity: Advance as tolerated. Pelvic rest for 6 weeks.  Diet: routine diet Future Appointments: Future Appointments  Date Time Provider [place]  03/29/2021  3:30 PM Roma Schanz, CNM [facility]   Follow up Visit:  Follow-up Information    [facility] Follow up.   Specialty: Obstetrics and Gynecology Why: as scheduled for pp visit Contact information: [id number] [place] [phone]                Please schedule this patient for a Virtual postpartum visit in 4 weeks with the following provider: Any provider. Additional Postpartum F/U:  Low risk pregnancy complicated by:  Delivery mode:  Vaginal, Spontaneous  Anticipated Birth Control:  POPs   02/17/2021 Roma Schanz, CNM

## 2021-02-16 NOTE — Lactation Note (Signed)
Lactation Consultation Note  Patient Name: Yvonne Ayala [id number]'M Date: 02/16/2021 Reason for consult: L&D Initial assessment;Term;1st time breastfeeding Age:20 y.o.   Initial L&D Consult:  Visited with family < 1 hour after birth.  Baby awake; offered to assist with latching and mother interested.  Assisted to latch easily and baby began to immediately suck with a wide gape and flanged lips.  Parents very excited since mother attempted to breast feed her first child but was unsuccessful. Observed her feeding for 5 minutes prior to exiting the room. Reassured parents that lactation services will be available on the M/B unit.  Allowed time for family bonding.   Maternal Data    Feeding Mother's Current Feeding Choice: Breast Milk  LATCH Score Latch: Grasps breast easily, tongue down, lips flanged, rhythmical sucking.  Audible Swallowing: A few with stimulation  Type of Nipple: Everted at rest and after stimulation  Comfort (Breast/Nipple): Soft / non-tender  Hold (Positioning): Assistance needed to correctly position infant at breast and maintain latch.  LATCH Score: 8   Lactation Tools Discussed/Used    Interventions Interventions: Assisted with latch;Skin to skin  Discharge    Consult Status Consult Status: Follow-up Date: 02/16/21 Follow-up type: In-patient    Dora Sims 02/16/2021, 6:35 AM

## 2021-02-16 NOTE — Lactation Note (Addendum)
This note was copied from a baby's chart. Lactation Consultation Note  Patient Name: Yvonne Ayala TKPTW'S Date: 02/16/2021 Reason for consult: Initial assessment;Term;1st time breastfeeding Age:22 hours   P2 mother whose infant is now 24 hours old.  This is a term baby at 40+0 weeks.  Mother did not breast feed her first child.    LC order placed at 0940 this morning.  I observed mother latching and feeding well in labor and delivery earlier this morning.  Mother has breast fed one additional time since then for approximately 20 minutes.  Mother feels like she latched and fed well.  Baby is currently swaddled and asleep in the bassinet.  Encouraged to feed 8-12 times/24 hours or sooner if baby shows feeding cues.  Reviewed cues and breast feeding basics.  Suggested mother call her RN/LC for latch assistance as needed.    Mom made aware of O/P services, breastfeeding support groups, community resources, and our phone # for post-discharge questions. Mother has a DEBP for home use.  Family member present.   Maternal Data Has patient been taught Hand Expression?: Yes Does the patient have breastfeeding experience prior to this delivery?: No  Feeding Mother's Current Feeding Choice: Breast Milk  LATCH Score Latch: Grasps breast easily, tongue down, lips flanged, rhythmical sucking.  Audible Swallowing: None  Type of Nipple: Everted at rest and after stimulation  Comfort (Breast/Nipple): Soft / non-tender  Hold (Positioning): Assistance needed to correctly position infant at breast and maintain latch.  LATCH Score: 7   Lactation Tools Discussed/Used    Interventions Interventions: Education  Discharge Pump: Personal  Consult Status Consult Status: Follow-up Date: 02/17/21 Follow-up type: In-patient    Yvonne Ayala 02/16/2021, 11:38 AM

## 2021-02-16 NOTE — Anesthesia Postprocedure Evaluation (Signed)
Anesthesia Post Note  Patient: Yvonne Ayala  Procedure(s) Performed: AN AD HOC LABOR EPIDURAL     Patient location during evaluation: Mother Baby Anesthesia Type: Epidural Level of consciousness: awake and alert Pain management: pain level not controlled Vital Signs Assessment: post-procedure vital signs reviewed and stable Respiratory status: spontaneous breathing, nonlabored ventilation and respiratory function stable Cardiovascular status: stable Postop Assessment: no headache, no backache and epidural receding Anesthetic complications: no Comments: Patient stated she did not received adequate pain relief from epidural. Delivered approximately 1 hour and half after epidural placement. Anesthesiologist was not notified.    No complications documented.  Last Vitals:  Vitals:   02/16/21 0731 02/16/21 0811  BP: 116/73 114/69  Pulse: 81 88  Resp: 16 18  Temp:    SpO2:  99%    Last Pain:  Vitals:   02/16/21 0545  TempSrc:   PainSc: 4    Pain Goal:                   [employer]

## 2021-02-16 NOTE — Anesthesia Preprocedure Evaluation (Signed)
Anesthesia Evaluation  Patient identified by MRN, date of birth, ID band Patient awake    Reviewed: Allergy & Precautions, H&P , NPO status , Patient's Chart, lab work & pertinent test results  History of Anesthesia Complications Negative for: history of anesthetic complications  Airway Mallampati: II  TM Distance: >3 FB Neck ROM: full    Dental no notable dental hx. (+) Teeth Intact   Pulmonary asthma ,    Pulmonary exam normal breath sounds clear to auscultation       Cardiovascular negative cardio ROS Normal cardiovascular exam Rhythm:regular Rate:Normal     Neuro/Psych PSYCHIATRIC DISORDERS negative neurological ROS     GI/Hepatic negative GI ROS, Neg liver ROS,   Endo/Other  Morbid obesity  Renal/GU negative Renal ROS  negative genitourinary   Musculoskeletal   Abdominal (+) + obese,   Peds  (+) ADHD Hematology negative hematology ROS (+)   Anesthesia Other Findings   Reproductive/Obstetrics (+) Pregnancy                             Anesthesia Physical Anesthesia Plan  ASA: III  Anesthesia Plan: Epidural   Post-op Pain Management:    Induction:   PONV Risk Score and Plan:   Airway Management Planned:   Additional Equipment:   Intra-op Plan:   Post-operative Plan:   Informed Consent: I have reviewed the patients History and Physical, chart, labs and discussed the procedure including the risks, benefits and alternatives for the proposed anesthesia with the patient or authorized representative who has indicated his/her understanding and acceptance.       Plan Discussed with:   Anesthesia Plan Comments:         Anesthesia Quick Evaluation

## 2021-02-17 DIAGNOSIS — Z3A40 40 weeks gestation of pregnancy: Secondary | ICD-10-CM

## 2021-02-17 MED ORDER — IBUPROFEN 600 MG PO TABS: 600.0000 mg | ORAL_TABLET | Freq: Four times a day (QID) | ORAL | 0 refills | Status: DC | PRN

## 2021-02-17 NOTE — Discharge Instructions (Signed)
NO SEX UNTIL AFTER YOU GET YOUR BIRTH CONTROL   Postpartum Care After Vaginal Delivery The following information offers guidance about how to care for yourself from the time you deliver your baby to 6-12 weeks after delivery (postpartum period). If you have problems or questions, contact your health care provider for more specific instructions. Follow these instructions at home: Vaginal bleeding  It is normal to have vaginal bleeding (lochia) after delivery. Wear a sanitary pad for bleeding and discharge. ? During the first week after delivery, the amount and appearance of lochia is often similar to a menstrual period. ? Over the next few weeks, it will gradually decrease to a dry, yellow-brown discharge. ? For most women, lochia stops completely by 4-6 weeks after delivery, but can vary.  Change your sanitary pads frequently. Watch for any changes in your flow, such as: ? A sudden increase in volume. ? A change in color. ? Large blood clots.  If you pass a blood clot from your vagina, save it and call your health care provider. Do not flush blood clots down the toilet before talking with your health care provider.  Do not use tampons or douches until your health care provider approves.  If you are not breastfeeding, your period should return 6-8 weeks after delivery. If you are feeding your baby breast milk only, your period may not return until you stop breastfeeding. Perineal care  Keep the area between the vagina and the anus (perineum) clean and dry. Use medicated pads and pain-relieving sprays and creams as directed.  If you had a surgical cut in the perineum (episiotomy) or a tear, check the area for signs of infection until you are healed. Check for: ? More redness, swelling, or pain. ? Fluid or blood coming from the cut or tear. ? Warmth. ? Pus or a bad smell.  You may be given a squirt bottle to use instead of wiping to clean the perineum area after you use the bathroom. Pat  the area gently to dry it.  To relieve pain caused by an episiotomy, a tear, or swollen veins in the anus (hemorrhoids), take a warm sitz bath 2-3 times a day. In a sitz bath, the warm water should only come up to your hips and cover your buttocks.   Breast care  In the first few days after delivery, your breasts may feel heavy, full, and uncomfortable (breast engorgement). Milk may also leak from your breasts. Ask your health care provider about ways to help relieve the discomfort.  If you are breastfeeding: ? Wear a bra that supports your breasts and fits well. Use breast pads to absorb milk that leaks. ? Keep your nipples clean and dry. Apply creams and ointments as told. ? You may have uterine contractions every time you breastfeed for up to several weeks after delivery. This helps your uterus return to its normal size. ? If you have any problems with breastfeeding, notify your health care provider or lactation consultant.  If you are not breastfeeding: ? Avoid touching your breasts. Do not squeeze out (express) milk. Doing this can make your breasts produce more milk. ? Wear a good-fitting bra and use cold packs to help with swelling. Intimacy and sexuality  Ask your health care provider when you can engage in sexual activity. This may depend upon: ? Your risk of infection. ? How fast you are healing. ? Your comfort and desire to engage in sexual activity.  You are able to get pregnant after  delivery, even if you have not had your period. Talk with your health care provider about methods of birth control (contraception) or family planning if you desire future pregnancies. Medicines  Take over-the-counter and prescription medicines only as told by your health care provider.  Take an over-the-counter stool softener to help ease bowel movements as told by your health care provider.  If you were prescribed an antibiotic medicine, take it as told by your health care provider. Do not  stop taking the antibiotic even if you start to feel better.  Review all previous and current prescriptions to check for possible transfer into breast milk. Activity  Gradually return to your normal activities as told by your health care provider.  Rest as much as possible. Nap while your baby is sleeping. Eating and drinking  Drink enough fluid to keep your urine pale yellow.  To help prevent or relieve constipation, eat high-fiber foods every day.  Choose healthy eating to support breastfeeding or weight loss goals.  Take your prenatal vitamins until your health care provider tells you to stop.   General tips/recommendations  Do not use any products that contain nicotine or tobacco. These products include cigarettes, chewing tobacco, and vaping devices, such as e-cigarettes. If you need help quitting, ask your health care provider.  Do not drink alcohol, especially if you are breastfeeding.  Do not take medications or drugs that are not prescribed to you, especially if you are breastfeeding.  Visit your health care provider for a postpartum checkup within the first 3-6 weeks after delivery.  Complete a comprehensive postpartum visit no later than 12 weeks after delivery.  Keep all follow-up visits for you and your baby. Contact a health care provider if:  You feel unusually sad or worried.  Your breasts become red, painful, or hard.  You have a fever or other signs of an infection.  You have bleeding that is soaking through one pad an hour or you have blood clots.  You have a severe headache that doesn't go away or you have vision changes.  You have nausea and vomiting and are unable to eat or drink anything for 24 hours. Get help right away if:  You have chest pain or difficulty breathing.  You have sudden, severe leg pain.  You faint or have a seizure.  You have thoughts about hurting yourself or your baby. If you ever feel like you may hurt yourself or others,  or have thoughts about taking your own life, get help right away. Go to your nearest emergency department or:  Call your local emergency services (911 in the [place]  The National Suicide Prevention Lifeline at 903-663-4517. This suicide crisis helpline is open 24 hours a day.  Text the Crisis Text Line at (289) 137-8136 (in the [place].). Summary  The period of time after you deliver your newborn up to 6-12 weeks after delivery is called the postpartum period.  Keep all follow-up visits for you and your baby.  Review all previous and current prescriptions to check for possible transfer into breast milk.  Contact a health care provider if you feel unusually sad or worried during the postpartum period. This information is not intended to replace advice given to you by your health care provider. Make sure you discuss any questions you have with your health care provider. Document Revised: 06/04/2020 Document Reviewed: 06/04/2020 Elsevier Patient Education  2021 [employer].   Breastfeeding  Choosing to breastfeed is one of the best decisions you can make  for yourself and your baby. A change in hormones during pregnancy causes your breasts to make breast milk in your milk-producing glands. Hormones prevent breast milk from being released before your baby is born. They also prompt milk flow after birth. Once breastfeeding has begun, thoughts of your baby, as well as his or her sucking or crying, can stimulate the release of milk from your milk-producing glands. Benefits of breastfeeding Research shows that breastfeeding offers many health benefits for infants and mothers. It also offers a cost-free and convenient way to feed your baby. For your baby  Your first milk (colostrum) helps your baby's digestive system to function better.  Special cells in your milk (antibodies) help your baby to fight off infections.  Breastfed babies are less likely to develop asthma, allergies, obesity, or type 2  diabetes. They are also at lower risk for sudden infant death syndrome (SIDS).  Nutrients in breast milk are better able to meet your baby's needs compared to infant formula.  Breast milk improves your baby's brain development. For you  Breastfeeding helps to create a very special bond between you and your baby.  Breastfeeding is convenient. Breast milk costs nothing and is always available at the correct temperature.  Breastfeeding helps to burn calories. It helps you to lose the weight that you gained during pregnancy.  Breastfeeding makes your uterus return faster to its size before pregnancy. It also slows bleeding (lochia) after you give birth.  Breastfeeding helps to lower your risk of developing type 2 diabetes, osteoporosis, rheumatoid arthritis, cardiovascular disease, and breast, ovarian, uterine, and endometrial cancer later in life. Breastfeeding basics Starting breastfeeding  Find a comfortable place to sit or lie down, with your neck and back well-supported.  Place a pillow or a rolled-up blanket under your baby to bring him or her to the level of your breast (if you are seated). Nursing pillows are specially designed to help support your arms and your baby while you breastfeed.  Make sure that your baby's tummy (abdomen) is facing your abdomen.  Gently massage your breast. With your fingertips, massage from the outer edges of your breast inward toward the nipple. This encourages milk flow. If your milk flows slowly, you may need to continue this action during the feeding.  Support your breast with 4 fingers underneath and your thumb above your nipple (make the letter "C" with your hand). Make sure your fingers are well away from your nipple and your baby's mouth.  Stroke your baby's lips gently with your finger or nipple.  When your baby's mouth is open wide enough, quickly bring your baby to your breast, placing your entire nipple and as much of the areola as possible  into your baby's mouth. The areola is the colored area around your nipple. ? More areola should be visible above your baby's upper lip than below the lower lip. ? Your baby's lips should be opened and extended outward (flanged) to ensure an adequate, comfortable latch. ? Your baby's tongue should be between his or her lower gum and your breast.  Make sure that your baby's mouth is correctly positioned around your nipple (latched). Your baby's lips should create a seal on your breast and be turned out (everted).  It is common for your baby to suck about 2-3 minutes in order to start the flow of breast milk. Latching Teaching your baby how to latch onto your breast properly is very important. An improper latch can cause nipple pain, decreased milk supply, and   poor weight gain in your baby. Also, if your baby is not latched onto your nipple properly, he or she may swallow some air during feeding. This can make your baby fussy. Burping your baby when you switch breasts during the feeding can help to get rid of the air. However, teaching your baby to latch on properly is still the best way to prevent fussiness from swallowing air while breastfeeding. Signs that your baby has successfully latched onto your nipple  Silent tugging or silent sucking, without causing you pain. Infant's lips should be extended outward (flanged).  Swallowing heard between every 3-4 sucks once your milk has started to flow (after your let-down milk reflex occurs).  Muscle movement above and in front of his or her ears while sucking. Signs that your baby has not successfully latched onto your nipple  Sucking sounds or smacking sounds from your baby while breastfeeding.  Nipple pain. If you think your baby has not latched on correctly, slip your finger into the corner of your baby's mouth to break the suction and place it between your baby's gums. Attempt to start breastfeeding again. Signs of successful breastfeeding Signs  from your baby  Your baby will gradually decrease the number of sucks or will completely stop sucking.  Your baby will fall asleep.  Your baby's body will relax.  Your baby will retain a small amount of milk in his or her mouth.  Your baby will let go of your breast by himself or herself. Signs from you  Breasts that have increased in firmness, weight, and size 1-3 hours after feeding.  Breasts that are softer immediately after breastfeeding.  Increased milk volume, as well as a change in milk consistency and color by the fifth day of breastfeeding.  Nipples that are not sore, cracked, or bleeding. Signs that your baby is getting enough milk  Wetting at least 1-2 diapers during the first 24 hours after birth.  Wetting at least 5-6 diapers every 24 hours for the first week after birth. The urine should be clear or pale yellow by the age of 5 days.  Wetting 6-8 diapers every 24 hours as your baby continues to grow and develop.  At least 3 stools in a 24-hour period by the age of 5 days. The stool should be soft and yellow.  At least 3 stools in a 24-hour period by the age of 7 days. The stool should be seedy and yellow.  No loss of weight greater than 10% of birth weight during the first 3 days of life.  Average weight gain of 4-7 oz (113-198 g) per week after the age of 4 days.  Consistent daily weight gain by the age of 5 days, without weight loss after the age of 2 weeks. After a feeding, your baby may spit up a small amount of milk. This is normal. Breastfeeding frequency and duration Frequent feeding will help you make more milk and can prevent sore nipples and extremely full breasts (breast engorgement). Breastfeed when you feel the need to reduce the fullness of your breasts or when your baby shows signs of hunger. This is called "breastfeeding on demand." Signs that your baby is hungry include:  Increased alertness, activity, or restlessness.  Movement of the head  from side to side.  Opening of the mouth when the corner of the mouth or cheek is stroked (rooting).  Increased sucking sounds, smacking lips, cooing, sighing, or squeaking.  Hand-to-mouth movements and sucking on fingers or hands.    Fussing or crying. Avoid introducing a pacifier to your baby in the first 4-6 weeks after your baby is born. After this time, you may choose to use a pacifier. Research has shown that pacifier use during the first year of a baby's life decreases the risk of sudden infant death syndrome (SIDS). Allow your baby to feed on each breast as long as he or she wants. When your baby unlatches or falls asleep while feeding from the first breast, offer the second breast. Because newborns are often sleepy in the first few weeks of life, you may need to awaken your baby to get him or her to feed. Breastfeeding times will vary from baby to baby. However, the following rules can serve as a guide to help you make sure that your baby is properly fed:  Newborns (babies 19 weeks of age or younger) may breastfeed every 1-3 hours.  Newborns should not go without breastfeeding for longer than 3 hours during the day or 5 hours during the night.  You should breastfeed your baby a minimum of 8 times in a 24-hour period. Breast milk pumping Pumping and storing breast milk allows you to make sure that your baby is exclusively fed your breast milk, even at times when you are unable to breastfeed. This is especially important if you go back to work while you are still breastfeeding, or if you are not able to be present during feedings. Your lactation consultant can help you find a method of pumping that works best for you and give you guidelines about how long it is safe to store breast milk.      Caring for your breasts while you breastfeed Nipples can become dry, cracked, and sore while breastfeeding. The following recommendations can help keep your breasts moisturized and healthy:  Avoid  using soap on your nipples.  Wear a supportive bra designed especially for nursing. Avoid wearing underwire-style bras or extremely tight bras (sports bras).  Air-dry your nipples for 3-4 minutes after each feeding.  Use only cotton bra pads to absorb leaked breast milk. Leaking of breast milk between feedings is normal.  Use lanolin on your nipples after breastfeeding. Lanolin helps to maintain your skin's normal moisture barrier. Pure lanolin is not harmful (not toxic) to your baby. You may also hand express a few drops of breast milk and gently massage that milk into your nipples and allow the milk to air-dry. In the first few weeks after giving birth, some women experience breast engorgement. Engorgement can make your breasts feel heavy, warm, and tender to the touch. Engorgement peaks within 3-5 days after you give birth. The following recommendations can help to ease engorgement:  Completely empty your breasts while breastfeeding or pumping. You may want to start by applying warm, moist heat (in the shower or with warm, water-soaked hand towels) just before feeding or pumping. This increases circulation and helps the milk flow. If your baby does not completely empty your breasts while breastfeeding, pump any extra milk after he or she is finished.  Apply ice packs to your breasts immediately after breastfeeding or pumping, unless this is too uncomfortable for you. To do this: ? Put ice in a plastic bag. ? Place a towel between your skin and the bag. ? Leave the ice on for 20 minutes, 2-3 times a day.  Make sure that your baby is latched on and positioned properly while breastfeeding. If engorgement persists after 48 hours of following these recommendations, contact your health care  provider or a Science writer. Overall health care recommendations while breastfeeding  Eat 3 healthy meals and 3 snacks every day. Well-nourished mothers who are breastfeeding need an additional 450-500  calories a day. You can meet this requirement by increasing the amount of a balanced diet that you eat.  Drink enough water to keep your urine pale yellow or clear.  Rest often, relax, and continue to take your prenatal vitamins to prevent fatigue, stress, and low vitamin and mineral levels in your body (nutrient deficiencies).  Do not use any products that contain nicotine or tobacco, such as cigarettes and e-cigarettes. Your baby may be harmed by chemicals from cigarettes that pass into breast milk and exposure to secondhand smoke. If you need help quitting, ask your health care provider.  Avoid alcohol.  Do not use illegal drugs or marijuana.  Talk with your health care provider before taking any medicines. These include over-the-counter and prescription medicines as well as vitamins and herbal supplements. Some medicines that may be harmful to your baby can pass through breast milk.  It is possible to become pregnant while breastfeeding. If birth control is desired, ask your health care provider about options that will be safe while breastfeeding your baby. Where to find more information: [employer]: www.llli.org Contact a health care provider if:  You feel like you want to stop breastfeeding or have become frustrated with breastfeeding.  Your nipples are cracked or bleeding.  Your breasts are red, tender, or warm.  You have: ? Painful breasts or nipples. ? A swollen area on either breast. ? A fever or chills. ? Nausea or vomiting. ? Drainage other than breast milk from your nipples.  Your breasts do not become full before feedings by the fifth day after you give birth.  You feel sad and depressed.  Your baby is: ? Too sleepy to eat well. ? Having trouble sleeping. ? More than 25 week old and wetting fewer than 6 diapers in a 24-hour period. ? Not gaining weight by 89 days of age.  Your baby has fewer than 3 stools in a 24-hour period.  Your baby's  skin or the white parts of his or her eyes become yellow. Get help right away if:  Your baby is overly tired (lethargic) and does not want to wake up and feed.  Your baby develops an unexplained fever. Summary  Breastfeeding offers many health benefits for infant and mothers.  Try to breastfeed your infant when he or she shows early signs of hunger.  Gently tickle or stroke your baby's lips with your finger or nipple to allow the baby to open his or her mouth. Bring the baby to your breast. Make sure that much of the areola is in your baby's mouth. Offer one side and burp the baby before you offer the other side.  Talk with your health care provider or lactation consultant if you have questions or you face problems as you breastfeed. This information is not intended to replace advice given to you by your health care provider. Make sure you discuss any questions you have with your health care provider. Document Revised: 12/14/2017 Document Reviewed: 10/21/2016 Elsevier Patient Education  2021 [employer].

## 2021-02-17 NOTE — Lactation Note (Signed)
This note was copied from a baby's chart. Lactation Consultation Note  Patient Name: Yvonne Ayala [id number] Date: 02/17/2021 Reason for consult: Follow-up assessment Age:22 hours   P2, Mother reports that she is breastfeeding before supplementing. Mother reports that she has a blister on the left nipple. Blister has dried and scabbed over now. Mother was given comfort gels with instructions and a harmony hand pump with instructions. #24 flange fit mother well. Mother has a Medela pump at home.   Discussed treatment and prevention of engorgement.  Discussed milk storage guidelines. Mother advised to continue to pump until her milk comes to volume and as long as she is giving formula.  Plan of Care : Breastfeed infant with feeding cues Supplement infant with ebm/formula, according to supplemental guidelines. Pump using a DEBP after each feeding for 15-20 mins.   Mother to continue to cue base feed infant and feed at least 8-12 times or more in 24 hours and advised to allow for cluster feeding infant as needed.  Mother to continue to due STS. Mother is aware of available LC services at [facility], [facility], OP Dept, and phone # for questions or concerns about breastfeeding.  Mother receptive to all teaching and plan of care.    Maternal Data    Feeding Mother's Current Feeding Choice: Breast Milk and Formula Nipple Type: Slow - flow  LATCH Score                    Lactation Tools Discussed/Used    Interventions    Discharge Discharge Education: Engorgement and breast care;Warning signs for feeding baby;Outpatient recommendation  Consult Status Consult Status: Complete    Michel Bickers 02/17/2021, 10:33 AM

## 2021-03-29 ENCOUNTER — Other Ambulatory Visit: Payer: Self-pay

## 2021-03-29 ENCOUNTER — Ambulatory Visit (INDEPENDENT_AMBULATORY_CARE_PROVIDER_SITE_OTHER): Payer: 59 | Admitting: Women's Health

## 2021-03-29 ENCOUNTER — Encounter: Payer: Self-pay | Admitting: Women's Health

## 2021-03-29 DIAGNOSIS — Z30011 Encounter for initial prescription of contraceptive pills: Secondary | ICD-10-CM | POA: Diagnosis not present

## 2021-03-29 DIAGNOSIS — O99345 Other mental disorders complicating the puerperium: Secondary | ICD-10-CM | POA: Diagnosis not present

## 2021-03-29 DIAGNOSIS — F53 Postpartum depression: Secondary | ICD-10-CM

## 2021-03-29 MED ORDER — LO LOESTRIN FE 1 MG-10 MCG / 10 MCG PO TABS: 1.0000 | ORAL_TABLET | Freq: Every day | ORAL | 3 refills | Status: DC

## 2021-03-29 NOTE — Patient Instructions (Signed)
Oral Contraception Use Oral contraceptive pills (OCPs) are medicines that prevent pregnancy. OCPs work by: Preventing the ovaries from releasing eggs. Thickening mucus in the lower part of the uterus (cervix). This prevents sperm from entering the uterus. Thinning the lining of the uterus (endometrium). This prevents a fertilized egg from attaching to the endometrium. Discuss possible side effects of OCPs with your health care provider. It cantake 2-3 months for your body to adjust to changes in hormone levels. What are the risks? OCPs can sometimes cause side effects, such as: Headache. Depression. Trouble sleeping. Nausea and vomiting. Breast tenderness. Irregular bleeding or spotting during the first several months. Bloating or fluid retention. Increase in blood pressure. OCPs with estrogen and progestins may slightly increase the risk of: Blood clots. Heart attack. Stroke. How to take OCPs Follow instructions from your health care provider about how to take your first cycle of OCPs. There are 2 types of OCPs. The first, combination OCPs, have both estrogen and progestins. The second, progestin-only pills, have only progestin. For combination OCPs, you may start the pill: On day 1 of your menstrual period. On the first Sunday after your period starts, or on the day you get your prescription. At any time of your cycle. If you start taking the pill within 5 days after the start of your period, you will not need a backup form of birth control, such as condoms. If you start at any other time of your menstrual cycle, you will need to use a backup form of birth control. For progestin-only OCPs: Ideally, you can start taking the pill on the first day of your menstrual period, but you can start it on any other day too. These pills will protect you from pregnancy after taking it for 2 days (48 hours). You can stop using a backup form of birth control after that time. It is important that you  take this pill at the same time every day. Even taking it 3 hours late can increase the risk of pregnancy. No matter which day you start the OCP, you will always start a new pack on that same day of the week. Have an extra pack of OCPs and a backup contraceptivemethod available in case you miss some pills or lose your OCP pack. Missed doses Follow instructions from your health care provider for missed doses. Information about missed doses can also be found in the patient information sheet that comes with your pack of pills. In general, for combined OCPs: If you forget to take the pill for 1 day, take it as soon as you can. This may mean taking 2 pills on the same day and at the same time. Take the next day's pill at the regular time. If you forget to take the pill for 2 days in a row, take 2 tablets on the day you remember and 2 tablets on the following day. A backup form of birth control should be used for 7 days after you are back on schedule. If you forget to take the pill for 3 days in a row, call your health care provider for directions on when to restart taking your pills. Do not take the missed pills. A backup form of birth control will be needed for 7 days once you restart your pills. If you use a pack that contains inactive pills and you miss 1 or more of the inactive pills, you do not need to take the missed doses. Skip them and start the new pack on   the regular day. For progestin-only OCPs: If your dose is 3 hours or more late, or if you miss 1 or more doses, take 1 missed pill as soon as you can. If you miss one or more doses, you must use a backup form of birth control. Some brands of progestin-only pills recommend using a backup form of birth control for 48 hours after a missed or late dose while others recommend 7 days. If you are not sure what to do, call your health care provider or check the patient information sheet that came with your pills. Follow these instructions at home: Do not  use any products that contain nicotine or tobacco. These include cigarettes, chewing tobacco, or vaping devices, such as e-cigarettes. If you need help quitting, ask your health care provider. Always use a condom to protect against STIs (sexually transmitted infections). Oral contraception pills do not protect against STIs. Use a calendar to mark the days of your menstrual period. Read the information sheet and directions that came with your OCP. Talk to your health care provider if you have questions. Contact a health care provider if: You develop nausea and vomiting. You have abnormal vaginal discharge or bleeding. You develop a rash. You miss your menstrual period. Depending on the type of OCP you are taking, this may be a sign of pregnancy. You are losing your hair. You need treatment for mood swings or depression. You get dizzy when taking the OCP. You develop acne after taking the OCP. You become pregnant or think you may be pregnant. You have diarrhea, constipation, and abdominal pain or cramps. You are not sure what to do after missing pills. Get help right away if: You develop chest pain. You develop shortness of breath. You have an uncontrolled or severe headache. You develop numbness or slurred speech. You develop vision or speech problems. You develop pain, redness, and swelling in your legs. You develop weakness or numbness in your arms or legs. These symptoms may represent a serious problem that is an emergency. Do not wait to see if the symptoms will go away. Get medical help right away. Call your local emergency services (911 in the [place] Do not drive yourself to the hospital. Summary Oral contraceptive pills (OCPs) are medicines that you take to prevent pregnancy. OCPs do not prevent sexually transmitted infections (STIs). Always use a condom to protect against STIs. When you start an OCP, be aware that it can take 2-3 months for your body to adjust to changes in hormone  levels. Read all the information and directions that come with your OCP. This information is not intended to replace advice given to you by your health care provider. Make sure you discuss any questions you have with your healthcare provider. Document Revised: 05/28/2020 Document Reviewed: 05/28/2020 Elsevier Patient Education  2022 [employer] Inc[employer]  

## 2021-03-29 NOTE — Progress Notes (Signed)
POSTPARTUM VISIT Patient name: Yvonne Ayala MRN [id number]  Date of birth: 09-13-2001 Chief Complaint:   Postpartum Care  History of Present Illness:   Yvonne Ayala is a 20 y.o. G65P2002 Caucasian female being seen today for a postpartum visit. She is 5 weeks postpartum following a spontaneous vaginal delivery at 40.0 gestational weeks. IOL: no, for n/a. Anesthesia: epidural.  Laceration: none.  Complications: none. Inpatient contraception: no.   Pregnancy uncomplicated. Tobacco use: no. Substance use disorder: no. Last pap smear: 08/14/20 and results were NILM w/ HRHPV not done. Next pap smear due: 2024 Patient's last menstrual period was 03/16/2021 (approximate).  Postpartum course has been uncomplicated. Bleeding none. Bowel function is normal. Bladder function is normal. Urinary incontinence? no, fecal incontinence? no Patient is not sexually active. Last sexual activity: prior to birth of baby. Desired contraception: OCPs. Does not smoke, no h/o HTN, DVT/PE, CVA, MI, or migraines w/ aura. Patient does not know if wants a pregnancy in the future.  Desired family size is maybe 3 children.   Upstream - 03/29/21 1542       Pregnancy Intention Screening   Does the patient want to become pregnant in the next year? No    Does the patient's partner want to become pregnant in the next year? No    Would the patient like to discuss contraceptive options today? No      Contraception Wrap Up   Current Method Abstinence    End Method Unknown/Not Reported    Contraception Counseling Provided No            The pregnancy intention screening data noted above was reviewed. Potential methods of contraception were discussed. The patient elected to proceed with Oral Contraceptive.   Edinburgh Postpartum Depression Screening: negative, does report some dep/anx on and off, had PPD after 1st baby- no meds. Has good support, still finds joy in things, eating well, not sleeping as well. Denies  SI/HI/II. Declines meds and IBH referral. Talks to mom which helps.   Edinburgh Postnatal Depression Scale - 03/29/21 1542       Edinburgh Postnatal Depression Scale:  In the Past 7 Days   I have been able to laugh and see the funny side of things. 0    I have looked forward with enjoyment to things. 0    I have blamed myself unnecessarily when things went wrong. 2    I have been anxious or worried for no good reason. 0    I have felt scared or panicky for no good reason. 2    Things have been getting on top of me. 1    I have been so unhappy that I have had difficulty sleeping. 0    I have felt sad or miserable. 0    I have been so unhappy that I have been crying. 0    The thought of harming myself has occurred to me. 0    Edinburgh Postnatal Depression Scale Total 5             GAD 7 : Generalized Anxiety Score 08/14/2020  Nervous, Anxious, on Edge 0  Control/stop worrying 0  Worry too much - different things 0  Trouble relaxing 0  Restless 0  Easily annoyed or irritable 0  Afraid - awful might happen 0  Total GAD 7 Score 0     Baby's course has been uncomplicated. Baby is feeding by bottle. Infant has a pediatrician/family doctor? Yes.  Pt has material  needs met for her and baby: Yes.   Review of Systems:   Pertinent items are noted in HPI Denies Abnormal vaginal discharge w/ itching/odor/irritation, headaches, visual changes, shortness of breath, chest pain, abdominal pain, severe nausea/vomiting, or problems with urination or bowel movements. Pertinent History Reviewed:  Reviewed past medical,surgical, obstetrical and family history.  Reviewed problem list, medications and allergies. OB History  Gravida Para Term Preterm AB Living  $Remov'2 2 2     2  'nOHHZm$ SAB IAB Ectopic Multiple Live Births        0 2    # Outcome Date GA Lbr Len/2nd Weight Sex Delivery Anes PTL Lv  2 Term 02/16/21 [redacted]w[redacted]d / 00:06 8 lb 6.9 oz (3.824 kg) F Vag-Spont EPI  LIV  1 Term 05/14/18 [redacted]w[redacted]d 28:46 /  00:37 7 lb 9.7 oz (3.45 kg) F Vag-Spont EPI  LIV     Birth Comments: WNL   Physical Assessment:   Vitals:   03/29/21 1543  BP: 133/86  Pulse: (!) 110  Weight: 246 lb 3.2 oz (111.7 kg)  Height: 5' 9.5" (1.765 m)  Body mass index is 35.84 kg/m.       Physical Examination:   General appearance: alert, well appearing, and in no distress  Mental status: alert, oriented to person, place, and time  Skin: warm & dry   Cardiovascular: normal heart rate noted   Respiratory: normal respiratory effort, no distress   Breasts: deferred, no complaints   Abdomen: soft, non-tender   Pelvic: examination not indicated. Thin prep pap obtained: No  Rectal: not examined  Extremities: Edema: none   Chaperone: N/A         No results found for this or any previous visit (from the past 24 hour(s)).  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  Mood and well being:  If positive depression screen, discussed and plan developed.  If using tobacco we discussed reduction/cessation and risk of relapse If current substance abuse, we discussed and referral to local resources was offered.   2. Infant care and feeding:  If breastfeeding, discussed returning to work, pumping, breastfeeding-associated pain, guidance regarding return to fertility while lactating if not using another method. If needed, patient was provided with a letter to be allowed to pump q 2-3hrs to support lactation in a private location with access to a refrigerator to store breastmilk.   Recommended that all caregivers be immunized for flu, pertussis and other preventable communicable diseases If pt does not have material needs met for  her/baby, referred to local resources for help obtaining these.  3. Sexuality, contraception and birth spacing Provided guidance regarding sexuality, management of dyspareunia, and resumption of intercourse Discussed avoiding interpregnancy interval <57mths and recommended birth spacing of 18 months  4. Sleep and fatigue Discussed coping options for fatigue and sleep disruption Encouraged family/partner/community support of 4 hrs of uninterrupted sleep to help with mood and fatigue  5. Physical recovery  If pt had a C/S, assessed incisional pain and providing guidance on normal vs prolonged recovery If pt had a laceration, perineal healing and pain reviewed.  If urinary or fecal incontinence, discussed management and referred to PT or uro/gyn if indicated  Patient is safe to  resume physical activity. Discussed attainment of healthy weight.  6.  Chronic disease management Discussed pregnancy complications if any, and their implications for future childbearing and long-term maternal health. Review recommendations for prevention of recurrent pregnancy complications, such as 17 hydroxyprogesterone caproate to reduce risk for recurrent PTB not applicable, or aspirin to reduce risk of preeclampsia not applicable. Pt had GDM: no. If yes, 2hr GTT scheduled: not applicable. Reviewed medications and non-pregnant dosing including consideration of whether pt is breastfeeding using a reliable resource such as LactMed: not applicable Referred for f/u w/ PCP or subspecialist providers as indicated: not applicable  7. Health maintenance Mammogram at 22yo or earlier if indicated Pap smears as indicated  Meds:  Meds ordered this encounter  Medications   LO LOESTRIN FE 1 MG-10 MCG / 10 MCG tablet    Sig: Take 1 tablet by mouth daily.    Dispense:  90 tablet    Refill:  3    For co-pay card, pt to text "Lo Loestrin Fe " to [phone]              Co-pay card must be run in second position  "other coverage  code 3"  if denied d/t PA, step edit, or insurance denial    Order Specific Question:   Supervising Provider    Answer:   Florian Buff [[id number]]     Follow-up: Return in about 3 months (around 06/29/2021) for med f/u, CNM, in person.   No orders of the defined types were placed in this encounter.   [place], [facility] 03/29/2021 4:04 PM

## 2021-06-29 ENCOUNTER — Ambulatory Visit: Payer: 59 | Admitting: Women's Health

## 2023-06-12 ENCOUNTER — Ambulatory Visit (INDEPENDENT_AMBULATORY_CARE_PROVIDER_SITE_OTHER): Payer: Self-pay

## 2023-06-12 VITALS — BP 114/73 | HR 75 | Ht 68.5 in | Wt 262.0 lb

## 2023-06-12 DIAGNOSIS — Z3202 Encounter for pregnancy test, result negative: Secondary | ICD-10-CM

## 2023-06-12 DIAGNOSIS — N644 Mastodynia: Secondary | ICD-10-CM

## 2023-06-12 DIAGNOSIS — Z3201 Encounter for pregnancy test, result positive: Secondary | ICD-10-CM

## 2023-06-12 DIAGNOSIS — R110 Nausea: Secondary | ICD-10-CM

## 2023-06-12 LAB — POCT URINE PREGNANCY: Preg Test, Ur: NEGATIVE

## 2023-06-12 NOTE — Progress Notes (Signed)
   NURSE VISIT- PREGNANCY CONFIRMATION   SUBJECTIVE:  Yvonne Ayala is a 22 y.o. [phone] female by certain LMP of Patient's last menstrual period was 05/17/2023 (exact date). Here for pregnancy confirmation.  Home pregnancy test: positive x 3   She reports  nausea and breast tenderness as why she took pregnancy tests .  She is not taking prenatal vitamins.    OBJECTIVE:  BP 114/73 (BP Location: Left Arm, Patient Position: Sitting, Cuff Size: Normal)   Pulse 75   Ht 5' 8.5" (1.74 m)   Wt 262 lb (118.8 kg)   LMP 05/17/2023 (Exact Date) Comment: positive home tests, negative office UPT  BMI 39.26 kg/m   Appears well, in no apparent distress  Results for orders placed or performed in visit on 06/12/23 (from the past 24 hour(s))  POCT urine pregnancy   Collection Time: 06/12/23  3:03 PM  Result Value Ref Range   Preg Test, Ur Negative Negative    ASSESSMENT: Negative pregnancy test    PLAN: Beta HCG today Prenatal vitamins:plans to begin OTC ASAP   Nausea medicines: not currently needed   OB packet given: No  Caralyn Guile  06/12/2023 3:04 PM

## 2023-06-13 ENCOUNTER — Telehealth: Payer: Self-pay

## 2023-06-13 ENCOUNTER — Other Ambulatory Visit: Payer: Self-pay | Admitting: Adult Health

## 2023-06-13 DIAGNOSIS — Z3202 Encounter for pregnancy test, result negative: Secondary | ICD-10-CM

## 2023-06-13 LAB — BETA HCG QUANT (REF LAB): hCG Quant: 2 m[IU]/mL

## 2023-06-13 NOTE — Telephone Encounter (Signed)
Patient would like to speak with someone about her test results °

## 2023-06-13 NOTE — Telephone Encounter (Signed)
Pt spotting, does not want to do [facility] in am, will repeat HPT next week

## 2023-06-13 NOTE — Telephone Encounter (Signed)
Pt started spotting this am. Pt is self pay. Can she wait until next week and repeat UPT and let us know what result is instead of repeating the quant? Thanks! JSY

## 2023-10-04 NOTE — L&D Delivery Note (Signed)
 [facility] Delivery Note  Yvonne Ayala is a 23 y.o. [id number] s/p SVD at [redacted]w[redacted]d. She was admitted for SOL.   ROM: rupture date, rupture time, delivery date, or delivery time have not been documented with clear fluid GBS Status: Negative Maximum Maternal Temperature: 97.45F  Labor Progress: Initial SVE 5.5/50/-2, progressed without augmentation to fully dilated/+1  Delivery Date/Time: 07/13/24 0418 Delivery: Called to room and patient was complete and pushing. Head delivered ROA. Double nuchal cord present, reduced one loop and continued delivery. Shoulder and body delivered in usual fashion. Infant with spontaneous cry, placed on mother's abdomen, dried and stimulated. Cord clamped x 2 after 1-minute delay, and cut by grandmother of baby. Cord blood drawn. Placenta delivered spontaneously, intact, with 3-vessel cord. Fundus firm with massage and Pitocin . Labia, perineum, vagina, and cervix inspected, no lacerations found.   Placenta: Intact, delivered spontaneously Complications: None Lacerations: None EBL: Analgesia: Epidural  Infant: Viable female  APGARs 8,9  4054g  Charlie DELENA Courts, MD 07/13/2024, 7:34 AM

## 2023-11-01 ENCOUNTER — Other Ambulatory Visit (HOSPITAL_BASED_OUTPATIENT_CLINIC_OR_DEPARTMENT_OTHER): Payer: Self-pay

## 2023-11-01 MED ORDER — OSELTAMIVIR PHOSPHATE 75 MG PO CAPS
75.0000 mg | ORAL_CAPSULE | Freq: Two times a day (BID) | ORAL | 0 refills | Status: DC
  Filled 2023-11-01: qty 10, 5d supply, fill #0

## 2023-11-04 DIAGNOSIS — Z419 Encounter for procedure for purposes other than remedying health state, unspecified: Secondary | ICD-10-CM | POA: Diagnosis not present

## 2023-11-15 DIAGNOSIS — Z419 Encounter for procedure for purposes other than remedying health state, unspecified: Secondary | ICD-10-CM | POA: Diagnosis not present

## 2023-11-17 ENCOUNTER — Ambulatory Visit (INDEPENDENT_AMBULATORY_CARE_PROVIDER_SITE_OTHER): Payer: Self-pay | Admitting: *Deleted

## 2023-11-17 VITALS — BP 115/75 | HR 95 | Ht 68.5 in | Wt 267.0 lb

## 2023-11-17 DIAGNOSIS — Z3201 Encounter for pregnancy test, result positive: Secondary | ICD-10-CM | POA: Diagnosis not present

## 2023-11-17 DIAGNOSIS — N926 Irregular menstruation, unspecified: Secondary | ICD-10-CM

## 2023-11-17 LAB — POCT URINE PREGNANCY: Preg Test, Ur: POSITIVE — AB

## 2023-11-17 NOTE — Progress Notes (Signed)
   NURSE VISIT- PREGNANCY CONFIRMATION   SUBJECTIVE:  Yvonne Ayala is a 23 y.o. G24P2002 female at [redacted]w[redacted]d by certain LMP of Patient's last menstrual period was 10/08/2023. Here for pregnancy confirmation.  Home pregnancy test: positive x 3   She reports no complaints.  She is not taking prenatal vitamins.    OBJECTIVE:  BP 115/75 (BP Location: Right Arm, Patient Position: Sitting, Cuff Size: Large)   Pulse 95   Ht 5' 8.5" (1.74 m)   Wt 267 lb (121.1 kg)   LMP 10/08/2023   BMI 40.01 kg/m   Appears well, in no apparent distress  Results for orders placed or performed in visit on 11/17/23 (from the past 24 hours)  POCT urine pregnancy   Collection Time: 11/17/23  8:43 AM  Result Value Ref Range   Preg Test, Ur Positive (A) Negative    ASSESSMENT: Positive pregnancy test, [redacted]w[redacted]d by LMP    PLAN: Schedule for dating ultrasound in 2 weeks Prenatal vitamins: plans to begin OTC ASAP   Nausea medicines: not currently needed   OB packet given: Yes  Jobe Marker  11/17/2023 8:44 AM

## 2023-11-29 ENCOUNTER — Other Ambulatory Visit: Payer: Self-pay | Admitting: Obstetrics & Gynecology

## 2023-11-29 DIAGNOSIS — O3680X0 Pregnancy with inconclusive fetal viability, not applicable or unspecified: Secondary | ICD-10-CM

## 2023-12-02 DIAGNOSIS — Z419 Encounter for procedure for purposes other than remedying health state, unspecified: Secondary | ICD-10-CM | POA: Diagnosis not present

## 2023-12-04 ENCOUNTER — Ambulatory Visit: Payer: Medicaid Other | Admitting: Radiology

## 2023-12-04 ENCOUNTER — Encounter: Payer: Self-pay | Admitting: Radiology

## 2023-12-04 DIAGNOSIS — O3680X0 Pregnancy with inconclusive fetal viability, not applicable or unspecified: Secondary | ICD-10-CM

## 2023-12-04 DIAGNOSIS — Z3491 Encounter for supervision of normal pregnancy, unspecified, first trimester: Secondary | ICD-10-CM | POA: Diagnosis not present

## 2023-12-04 DIAGNOSIS — Z3A08 8 weeks gestation of pregnancy: Secondary | ICD-10-CM

## 2023-12-04 NOTE — Progress Notes (Signed)
 [place]:  GA by LMP = 8+1 weeks Anteverted uterus with viable early IUP,  GS intact, CRL = 19.5 mm = 8+3 weeks,  FHR = 174 bpm,   YS = 5.1 mm  Normal ovaries  EDD by LMP = 07-14-24

## 2023-12-19 ENCOUNTER — Encounter: Payer: Self-pay | Admitting: Obstetrics & Gynecology

## 2023-12-20 MED ORDER — ONDANSETRON 8 MG PO TBDP: 8.0000 mg | ORAL_TABLET | Freq: Three times a day (TID) | ORAL | 1 refills | Status: DC | PRN

## 2023-12-20 MED ORDER — PRENATAL PLUS VITAMIN/MINERAL 27-1 MG PO TABS
1.0000 | ORAL_TABLET | Freq: Every day | ORAL | 12 refills | Status: AC
  Filled 2024-05-30: qty 30, 30d supply, fill #0
  Filled 2024-07-02: qty 30, 30d supply, fill #1
  Filled 2024-09-02: qty 30, 30d supply, fill #2

## 2023-12-20 NOTE — Addendum Note (Signed)
 Addended by: Lazaro Arms on: 12/20/2023 03:25 PM   Modules accepted: Orders

## 2023-12-29 ENCOUNTER — Other Ambulatory Visit: Payer: Self-pay | Admitting: Obstetrics & Gynecology

## 2023-12-29 ENCOUNTER — Encounter: Payer: Self-pay | Admitting: Advanced Practice Midwife

## 2023-12-29 DIAGNOSIS — Z3682 Encounter for antenatal screening for nuchal translucency: Secondary | ICD-10-CM

## 2024-01-01 ENCOUNTER — Ambulatory Visit: Admitting: Advanced Practice Midwife

## 2024-01-01 ENCOUNTER — Ambulatory Visit

## 2024-01-01 ENCOUNTER — Encounter: Admitting: *Deleted

## 2024-01-01 ENCOUNTER — Encounter: Payer: Self-pay | Admitting: Advanced Practice Midwife

## 2024-01-01 VITALS — BP 112/79 | HR 93 | Wt 270.0 lb

## 2024-01-01 DIAGNOSIS — O0990 Supervision of high risk pregnancy, unspecified, unspecified trimester: Secondary | ICD-10-CM | POA: Insufficient documentation

## 2024-01-01 DIAGNOSIS — Z3A12 12 weeks gestation of pregnancy: Secondary | ICD-10-CM

## 2024-01-01 DIAGNOSIS — Z3481 Encounter for supervision of other normal pregnancy, first trimester: Secondary | ICD-10-CM | POA: Diagnosis not present

## 2024-01-01 DIAGNOSIS — Z363 Encounter for antenatal screening for malformations: Secondary | ICD-10-CM

## 2024-01-01 DIAGNOSIS — Z131 Encounter for screening for diabetes mellitus: Secondary | ICD-10-CM

## 2024-01-01 DIAGNOSIS — Z6841 Body Mass Index (BMI) 40.0 and over, adult: Secondary | ICD-10-CM

## 2024-01-01 DIAGNOSIS — Z3682 Encounter for antenatal screening for nuchal translucency: Secondary | ICD-10-CM

## 2024-01-01 DIAGNOSIS — Z3480 Encounter for supervision of other normal pregnancy, unspecified trimester: Secondary | ICD-10-CM | POA: Diagnosis not present

## 2024-01-01 DIAGNOSIS — Z3490 Encounter for supervision of normal pregnancy, unspecified, unspecified trimester: Secondary | ICD-10-CM | POA: Insufficient documentation

## 2024-01-01 NOTE — Progress Notes (Addendum)
 INITIAL OBSTETRICAL VISIT Patient name: Yvonne Ayala MRN [id number]  Date of birth: June 05, 2001 Chief Complaint:   Initial Prenatal Visit  History of Present Illness:   Yvonne Ayala is a 23 y.o. G49P2002  female at [redacted]w[redacted]d by LMP c/w u/s at 8 weeks with an Estimated Date of Delivery: 07/14/24 being seen today for her initial obstetrical visit.   Her obstetrical history is significant for term SVD w/o problems X 2. Hx PPD/anxiety.   Today she reports no complaints      01/01/2024   10:47 AM 08/14/2020   11:27 AM 10/16/2017    2:22 PM 09/07/2017   10:52 AM  Depression screen PHQ 2/9  Decreased Interest 0 0 0 0  Down, Depressed, Hopeless 0 0 0 0  PHQ - 2 Score 0 0 0 0  Altered sleeping 1 1 0 0  Tired, decreased energy 1 0 0 0  Change in appetite 1 1 0 0  Feeling bad or failure about yourself  0 0 0 0  Trouble concentrating 0 0 0 0  Moving slowly or fidgety/restless 0 0 0 0  Suicidal thoughts 0 0 0 0  PHQ-9 Score 3 2 0 0    Patient's last menstrual period was 10/08/2023. Last pap  Diagnosis  Date Value Ref Range Status  08/14/2020   Final   - Negative for intraepithelial lesion or malignancy (NILM)   Review of Systems:   Pertinent items are noted in HPI Denies cramping/contractions, leakage of fluid, vaginal bleeding, abnormal vaginal discharge w/ itching/odor/irritation, headaches, visual changes, shortness of breath, chest pain, abdominal pain, severe nausea/vomiting, or problems with urination or bowel movements unless otherwise stated above.  Pertinent History Reviewed:  Reviewed past medical,surgical, social, obstetrical and family history.  Reviewed problem list, medications and allergies. OB History  Gravida Para Term Preterm AB Living  3 2 2   2   SAB IAB Ectopic Multiple Live Births     0 2    # Outcome Date GA Lbr Len/2nd Weight Sex Type Anes PTL Lv  3 Current           2 Term 02/16/21 [redacted]w[redacted]d / 00:06 8 lb 6.9 oz (3.824 kg) F Vag-Spont EPI N LIV  1 Term  05/14/18 [redacted]w[redacted]d 28:46 / 00:37 7 lb 9.7 oz (3.45 kg) F Vag-Spont EPI N LIV     Birth Comments: WNL   Physical Assessment:   Vitals:   01/01/24 1028  BP: 112/79  Pulse: 93  Weight: 270 lb (122.5 kg)  Body mass index is 40.46 kg/m.       Physical Examination:  General appearance - well appearing, and in no distress  Mental status - alert, oriented to person, place, and time  Psych:  She has a normal mood and affect  Skin - warm and dry, normal color, no suspicious lesions noted  Chest - effort normal  Heart - normal rate and regular rhythm  Abdomen - soft, nontender  Extremities:  No swelling or varicosities noted   TODAY'S NT  [place] 12+1 wks,measurements c/w dates,FHR 150 bpm,normal ovaries,posterior placenta gr 0,NB present,NT 1.5 mm,CRL 60.96 mm        No results found for this or any previous visit (from the past 24 hours).   Indications for ASA therapy (per uptodate)   Two or more of the following: Nulliparity No Obesity (body mass index >30 kg/m2) Yes Family history of preeclampsia in mother or sister unknow, adopted Age >=35 years No  01/01/2024   10:47 AM 08/14/2020   11:27 AM 10/16/2017    2:22 PM 09/07/2017   10:52 AM  Depression screen PHQ 2/9  Decreased Interest 0 0 0 0  Down, Depressed, Hopeless 0 0 0 0  PHQ - 2 Score 0 0 0 0  Altered sleeping 1 1 0 0  Tired, decreased energy 1 0 0 0  Change in appetite 1 1 0 0  Feeling bad or failure about yourself  0 0 0 0  Trouble concentrating 0 0 0 0  Moving slowly or fidgety/restless 0 0 0 0  Suicidal thoughts 0 0 0 0  PHQ-9 Score 3 2 0 0        01/01/2024   10:47 AM 08/14/2020   11:28 AM  GAD 7 : Generalized Anxiety Score  Nervous, Anxious, on Edge 0 0  Control/stop worrying 0 0  Worry too much - different things 1 0  Trouble relaxing 0 0  Restless 0 0  Easily annoyed or irritable 2 0  Afraid - awful might happen 0 0  Total GAD 7 Score 3 0      Assessment & Plan:  1) High-Risk Pregnancy G3P2002  at [redacted]w[redacted]d with an Estimated Date of Delivery: 07/14/24   2) Initial OB visit    1. Supervision of other normal pregnancy, antepartum (Primary)  2. [redacted] weeks gestation of pregnancy  - Integrated 1 - Urine Culture - Hemoglobin A1c - PANORAMA PRENATAL TEST - CHL AMB BABYSCRIPTS SCHEDULE OPTIMIZATION - CBC/D/Plt+RPR+Rh+ABO+RubIgG... - GC/Chlamydia Probe Amp  3. Screening for diabetes mellitus (DM)  - Hemoglobin A1c  4. BMI 40.0-44.9, adult (HCC)  EFW q 4 weeks @ 24 weeks, testing at 34 weeks, deliver 39-40       Meds: No orders of the defined types were placed in this encounter.   Initial labs obtained Continue prenatal vitamins Reviewed n/v relief measures and warning s/s to report Reviewed recommended weight gain based on pre-gravid BMI Encouraged well-balanced diet Genetic & carrier screening discussed: requests Panorama, NT/IT, and Horizon , declines AFP Ultrasound discussed; fetal survey: requested CCNC completed> form faxed if has or is planning to apply for medicaid The nature of [facility] [employer] with multiple MDs and other Advanced Practice Providers was explained to patient; also emphasized that fellows, residents, and students are part of our team. Has home bp cuff. . Check bp weekly, let us know if >140/90.        Scarlette Calico Cresenzo-Dishmon 12:02 PM

## 2024-01-01 NOTE — Patient Instructions (Signed)
 Yvonne Ayala, I greatly value your feedback.  If you receive a survey following your visit with [place] today, we appreciate you taking the time to fill it out.  Thanks, Yvonne Beams, DNP, CNM  [facility] HAS MOVED!!! It is now [facility] at [facility] ([address] [place], [place] [address]) Entrance located off of [address] Free 24/7 valet parking   Nausea & Vomiting Have saltine crackers or pretzels by your bed and eat a few bites before you raise your head out of bed in the morning Eat small frequent meals throughout the day instead of large meals Drink plenty of fluids throughout the day to stay hydrated, just don't drink a lot of fluids with your meals.  This can make your stomach fill up faster making you feel sick Do not brush your teeth right after you eat Products with real ginger are good for nausea, like ginger ale and ginger hard candy Make sure it says made with real ginger! Sucking on sour candy like lemon heads is also good for nausea If your prenatal vitamins make you nauseated, take them at night so you will sleep through the nausea Sea Bands If you feel like you need medicine for the nausea & vomiting please let us know If you are unable to keep any fluids or food down please let us know   Constipation Drink plenty of fluid, preferably water, throughout the day Eat foods high in fiber such as fruits, vegetables, and grains Exercise, such as walking, is a good way to keep your bowels regular Drink warm fluids, especially warm prune juice, or decaf coffee Eat a 1/2 cup of real oatmeal (not instant), 1/2 cup applesauce, and 1/2-1 cup warm prune juice every day If needed, you may take Colace (docusate sodium) stool softener once or twice a day to help keep the stool soft.  If you still are having problems with constipation, you may take Miralax once daily as needed to help keep your bowels regular.   Home Blood Pressure Monitoring for  Patients   Your provider has recommended that you check your blood pressure (BP) at least once a week at home. If you do not have a blood pressure cuff at home, one will be provided for you. Contact your provider if you have not received your monitor within 1 week.   Helpful Tips for Accurate Home Blood Pressure Checks  Don't smoke, exercise, or drink caffeine 30 minutes before checking your BP Use the restroom before checking your BP (a full bladder can raise your pressure) Relax in a comfortable upright chair Feet on the ground Left arm resting comfortably on a flat surface at the level of your heart Legs uncrossed Back supported Sit quietly and don't talk Place the cuff on your bare arm Adjust snuggly, so that only two fingertips can fit between your skin and the top of the cuff Check 2 readings separated by at least one minute Keep a log of your BP readings For a visual, please reference this diagram: http://ccnc.care/bpdiagram  Provider Name: [facility]     Phone: [phone]  Zone 1: ALL CLEAR  Continue to monitor your symptoms:  BP reading is less than 140 (top number) or less than 90 (bottom number)  No right upper stomach pain No headaches or seeing spots No feeling nauseated or throwing up No swelling in face and hands  Zone 2: CAUTION Call your doctor's office for any of the following:  BP reading is greater than 140 (top number) or greater than 90 (bottom number)  Stomach pain under your ribs in the middle or right side Headaches or seeing spots Feeling nauseated or throwing up Swelling in face and hands  Zone 3: EMERGENCY  Seek immediate medical care if you have any of the following:  BP reading is greater than160 (top number) or greater than 110 (bottom number) Severe headaches not improving with Tylenol Serious difficulty catching your breath Any worsening symptoms from Zone 2    First Trimester of Pregnancy The first trimester of pregnancy is from  week 1 until the end of week 12 (months 1 through 3). A week after a sperm fertilizes an egg, the egg will implant on the wall of the uterus. This embryo will begin to develop into a baby. Genes from you and your partner are forming the baby. The female genes determine whether the baby is a boy or a girl. At 6-8 weeks, the eyes and face are formed, and the heartbeat can be seen on ultrasound. At the end of 12 weeks, all the baby's organs are formed.  Now that you are pregnant, you will want to do everything you can to have a healthy baby. Two of the most important things are to get good prenatal care and to follow your health care provider's instructions. Prenatal care is all the medical care you receive before the baby's birth. This care will help prevent, find, and treat any problems during the pregnancy and childbirth. BODY CHANGES Your body goes through many changes during pregnancy. The changes vary from woman to woman.  You may gain or lose a couple of pounds at first. You may feel sick to your stomach (nauseous) and throw up (vomit). If the vomiting is uncontrollable, call your health care provider. You may tire easily. You may develop headaches that can be relieved by medicines approved by your health care provider. You may urinate more often. Painful urination may mean you have a bladder infection. You may develop heartburn as a result of your pregnancy. You may develop constipation because certain hormones are causing the muscles that push waste through your intestines to slow down. You may develop hemorrhoids or swollen, bulging veins (varicose veins). Your breasts may begin to grow larger and become tender. Your nipples may stick out more, and the tissue that surrounds them (areola) may become darker. Your gums may bleed and may be sensitive to brushing and flossing. Dark spots or blotches (chloasma, mask of pregnancy) may develop on your face. This will likely fade after the baby is  born. Your menstrual periods will stop. You may have a loss of appetite. You may develop cravings for certain kinds of food. You may have changes in your emotions from day to day, such as being excited to be pregnant or being concerned that something may go wrong with the pregnancy and baby. You may have more vivid and strange dreams. You may have changes in your hair. These can include thickening of your hair, rapid growth, and changes in texture. Some women also have hair loss during or after pregnancy, or hair that feels dry or thin. Your hair will most likely return to normal after your baby is born. WHAT TO EXPECT AT YOUR PRENATAL VISITS During a routine prenatal visit: You will be weighed to make sure you and the baby are growing normally. Your blood pressure will be taken. Your abdomen will be measured to track your baby's growth. The fetal  heartbeat will be listened to starting around week 10 or 12 of your pregnancy. Test results from any previous visits will be discussed. Your health care provider may ask you: How you are feeling. If you are feeling the baby move. If you have had any abnormal symptoms, such as leaking fluid, bleeding, severe headaches, or abdominal cramping. If you have any questions. Other tests that may be performed during your first trimester include: Blood tests to find your blood type and to check for the presence of any previous infections. They will also be used to check for low iron levels (anemia) and Rh antibodies. Later in the pregnancy, blood tests for diabetes will be done along with other tests if problems develop. Urine tests to check for infections, diabetes, or protein in the urine. An ultrasound to confirm the proper growth and development of the baby. An amniocentesis to check for possible genetic problems. Fetal screens for spina bifida and Down syndrome. You may need other tests to make sure you and the baby are doing well. HOME CARE  INSTRUCTIONS  Medicines Follow your health care provider's instructions regarding medicine use. Specific medicines may be either safe or unsafe to take during pregnancy. Take your prenatal vitamins as directed. If you develop constipation, try taking a stool softener if your health care provider approves. Diet Eat regular, well-balanced meals. Choose a variety of foods, such as meat or vegetable-based protein, fish, milk and low-fat dairy products, vegetables, fruits, and whole grain breads and cereals. Your health care provider will help you determine the amount of weight gain that is right for you. Avoid raw meat and uncooked cheese. These carry germs that can cause birth defects in the baby. Eating four or five small meals rather than three large meals a day may help relieve nausea and vomiting. If you start to feel nauseous, eating a few soda crackers can be helpful. Drinking liquids between meals instead of during meals also seems to help nausea and vomiting. If you develop constipation, eat more high-fiber foods, such as fresh vegetables or fruit and whole grains. Drink enough fluids to keep your urine clear or pale yellow. Activity and Exercise Exercise only as directed by your health care provider. Exercising will help you: Control your weight. Stay in shape. Be prepared for labor and delivery. Experiencing pain or cramping in the lower abdomen or low back is a good sign that you should stop exercising. Check with your health care provider before continuing normal exercises. Try to avoid standing for long periods of time. Move your legs often if you must stand in one place for a long time. Avoid heavy lifting. Wear low-heeled shoes, and practice good posture. You may continue to have sex unless your health care provider directs you otherwise. Relief of Pain or Discomfort Wear a good support bra for breast tenderness.   Take warm sitz baths to soothe any pain or discomfort caused by  hemorrhoids. Use hemorrhoid cream if your health care provider approves.   Rest with your legs elevated if you have leg cramps or low back pain. If you develop varicose veins in your legs, wear support hose. Elevate your feet for 15 minutes, 3-4 times a day. Limit salt in your diet. Prenatal Care Schedule your prenatal visits by the twelfth week of pregnancy. They are usually scheduled monthly at first, then more often in the last 2 months before delivery. Write down your questions. Take them to your prenatal visits. Keep all your prenatal visits as directed  by your health care provider. Safety Wear your seat belt at all times when driving. Make a list of emergency phone numbers, including numbers for family, friends, the hospital, and police and fire departments. General Tips Ask your health care provider for a referral to a local prenatal education class. Begin classes no later than at the beginning of month 6 of your pregnancy. Ask for help if you have counseling or nutritional needs during pregnancy. Your health care provider can offer advice or refer you to specialists for help with various needs. Do not use hot tubs, steam rooms, or saunas. Do not douche or use tampons or scented sanitary pads. Do not cross your legs for long periods of time. Avoid cat litter boxes and soil used by cats. These carry germs that can cause birth defects in the baby and possibly loss of the fetus by miscarriage or stillbirth. Avoid all smoking, herbs, alcohol, and medicines not prescribed by your health care provider. Chemicals in these affect the formation and growth of the baby. Schedule a dentist appointment. At home, brush your teeth with a soft toothbrush and be gentle when you floss. SEEK MEDICAL CARE IF:  You have dizziness. You have mild pelvic cramps, pelvic pressure, or nagging pain in the abdominal area. You have persistent nausea, vomiting, or diarrhea. You have a bad smelling vaginal  discharge. You have pain with urination. You notice increased swelling in your face, hands, legs, or ankles. SEEK IMMEDIATE MEDICAL CARE IF:  You have a fever. You are leaking fluid from your vagina. You have spotting or bleeding from your vagina. You have severe abdominal cramping or pain. You have rapid weight gain or loss. You vomit blood or material that looks like coffee grounds. You are exposed to [place] measles and have never had them. You are exposed to fifth disease or chickenpox. You develop a severe headache. You have shortness of breath. You have any kind of trauma, such as from a fall or a car accident. Document Released: 09/13/2001 Document Revised: 02/03/2014 Document Reviewed: 07/30/2013 [facility] Patient Information 2015 [place], [place]. This information is not intended to replace advice given to you by your health care provider. Make sure you discuss any questions you have with your health care provider.  ADDITIONAL HEALTHCARE OPTIONS FOR PATIENTS  [place] [facility] / e-Visit: [email or web address]         [facility]: [phone]  [facility]: [phone]                   [facility]: [phone]     Safe Medications in Pregnancy   Acne: Benzoyl Peroxide Salicylic Acid  Backache/Headache: Tylenol: 2 regular strength every 4 hours OR              2 Extra strength every 6 hours  Colds/Coughs/Allergies: Benadryl (alcohol free) 25 mg every 6 hours as needed Breath right strips Claritin Cepacol throat lozenges Chloraseptic throat spray Cold-Eeze- up to three times per day Cough drops, alcohol free Flonase (by prescription only) Guaifenesin Mucinex Robitussin DM (plain only, alcohol free) Saline nasal spray/drops Sudafed (pseudoephedrine) & Actifed ** use only after [redacted] weeks gestation and if you do not have high blood pressure Tylenol Vicks Vaporub Zinc  lozenges Zyrtec   Constipation: Colace Ducolax suppositories Fleet enema Glycerin suppositories Metamucil Milk of magnesia Miralax Senokot Smooth move tea  Diarrhea: Kaopectate Imodium A-D  *NO pepto Bismol  Hemorrhoids: Anusol Anusol HC Preparation H Tucks  Indigestion: Tums Maalox  Mylanta Zantac  Pepcid  Insomnia: Benadryl (alcohol free) 25mg  every 6 hours as needed Tylenol PM Unisom, no Gelcaps  Leg Cramps: Tums MagGel  Nausea/Vomiting:  Bonine Dramamine Emetrol Ginger extract Sea bands Meclizine  Nausea medication to take during pregnancy:  Unisom (doxylamine succinate 25 mg tablets) Take one tablet daily at bedtime. If symptoms are not adequately controlled, the dose can be increased to a maximum recommended dose of two tablets daily (1/2 tablet in the morning, 1/2 tablet mid-afternoon and one at bedtime). Vitamin B6 100mg  tablets. Take one tablet twice a day (up to 200 mg per day).  Skin Rashes: Aveeno products Benadryl cream or 25mg  every 6 hours as needed Calamine Lotion 1% cortisone cream  Yeast infection: Gyne-lotrimin 7 Monistat 7   **If taking multiple medications, please check labels to avoid duplicating the same active ingredients **take medication as directed on the label ** Do not exceed 4000 mg of tylenol in 24 hours **Do not take medications that contain aspirin or ibuprofen   ([phone] is the phone number for Pregnancy Classes or hospital tours at [facility].   You will be referred to  [email or web address]   for more information on childbirth classes   At this site you may register for classes. You may sign up for a waiting list if classes are full. Please SIGN UP FOR THIS!.   When the waiting list becomes long, sometimes new classes can be added.  [facility] at [facility] Call to Register: [phone] or  [phone]   or   Register Online: [email or web address] THESE CLASSES FILL UP VERY QUICKLY, SO SIGN UP AS SOON AS YOU CAN!!! Please visit [facility] pregnancy website at [email or web address]  Childbirth Classes  Option 1: Birth & Baby Series Series of 3 weekly classes, on the same day of the week (can choose Mon-Thurs) from 6-9pm Helps you and your support person prepare for childbirth Reviews newborn care, labor & birth, cesarean birth, pain management, and comfort techniques Cost: $60 per couple for insured or self-pay, $30 per couple for Medicaid  Option 2: Weekend Birth & Baby This class is a weekend version of our Birth & Baby series.  It is designed for parents who have a difficult time fitting several weeks of classes into their schedule.   Covers the care of your newborn and the basics of labor and childbirth Friday 6:30pm-8:30pm Saturday 9am-4pm, includes lunch for you and your partner  Cost: $75 per couple for insured or self-pay, $30 per couple for Medicaid  Option 3: Natural Childbirth This series of 5 weekly classes is for expectant parents who want to learn and practice natural methods of coping with the process of labor and childbirth.  Can choose Mon or Tues, 7-9pm.   Covers relaxation, breathing, massage, visualization, role of the partner, and helpful positioning Participants learn how to be confident in their body's ability to give birth. Class empowers and helps parents make informed decisions about care. Includes discussion that will help new parents transition into the immediate postpartum period.  Cost: $75 per couple for insured or self-pay, $30 per couple for Medicaid  Option 4: Online Birth & Baby This online class offers you the freedom to complete a Birth & Baby series in the comfort of your own home.  The flexibility of this option allows you to review sections at your own pace, at times convenient to you and your support people.  It includes additional  video information, animations, quizzes and extended activities. Get  organized with helpful eClass tools, checklists, and trackers.  Cost: $60 for 60 days of online access                                                                            Other Available Classes  Baby & Me Enjoy this time to discuss newborn & infant parenting topics and family adjustment issues with other new mothers in a relaxed environment. Each week brings a new speaker or baby-centered activity. We encourage mothers and their babies (birth to crawling) to join [place]. You are welcome to visit this group even if you haven't delivered yet! It's wonderful to make new friends early and watch other moms interact with their babies. No registration or fee.  Big Brother/Big Sister Let your children share in the joy of a new brother or sister in this special class designed just for them. Discussion includes how families care for babies: swaddling, holding, diapering, safety, as well as how they can be helpful in their new role. This class is designed for children ages 2 to 71, but any age is welcome. Please register each child individually. $5 Breastfeeding Support Group This group is a mother-to-mother support circle where moms have the opportunity to share their breastfeeding experiences. A Breastfeeding Support nurse is present for questions and concerns. An infant scale is available for weight checks. No fee or registration.  Breastfeeding Your Baby Breastfeeding is a special time for mother and child. This class will help you feel ready to begin this important relationship. Your partner is encouraged to attend with you. Learn what to expect and feel more confident in the first days of breastfeeding your newborn. This class also addresses the most common fears and challenges of breastfeeding during the first few weeks, months, and beyond. $30 per couple Caring for Baby This class is for expectant and adoptive parents who want to  learn and practice the most up-to-date newborn care for their babies. Focus is on birth through first six weeks of life. Topics include feeding, bathing, diapering, crying, umbilical cord care, circumcision care and safe sleep. Parents learn how to recognize symptoms of illness and when to call the pediatrician. Register only the mom-to-be and your partner can plan to come with you. (*Note: This class is included in the Birth & Baby series and the Weekend Birth & Baby classes.) $10 per couple Comfort Techniques & Tour This 2-hour interactive class is designed for those who either do not wish to take the Birth & Baby series or for those who prefer our online childbirth class, but don't want to miss the opportunity to learn and practice hands-on techniques. These skills can help relieve some of the discomfort of labor and encourage your baby to rotate toward the best position for birth. You and your partner will be able to try a variety of labor positions with birth balls and rebozos as well as practice breathing, relaxation, and visual techniques. $20 per couple [employer] [facility] This course offers Dads-to-be the tools and knowledge needed to feel confident on their journey to becoming new fathers. Experienced dads, who have been trained as coaches, teach dads-to-be how to hold, comfort, diapers, swaddle and play with their infant while being  able to support the new mom as well. $25 Grandparent Love Expecting a grandbaby? Learn about the latest infant care and safety recommendations and ways to support your own child as he or she transitions into the parenting role. $10 per person Infant and Child CPR Parents, grandparents, babysitters, and friends learn Cardio-Pulmonary Resuscitation skills for infants and children. You will also learn how to treat both conscious and unconscious choking infants and children. Register each participant individually. (Note: This Family & Friends program does not offer  certification.) $20 per person Marvelous Multiples Expecting twins, triplets, or more? This free 2-hour class covers the differences in labor, birth, parenting, and breastfeeding issues that face multiples' parents.  Maternity Care Center Virtual Tour  Online virtual tour of the new [place] [facility] at [facility] Talk This free mom-led group offers support and connection to mothers as they journey through the adjustments and struggles of that sometimes overwhelming first year after the birth of a child. A member of our staff will be present to share resources and additional support if needed, as you care for yourself and baby. You are welcome to visit this group before you deliver! It's wonderful to meet new friends early and watch other moms interact with their babies.  Waterbirth Class Interested in a waterbirth? This free informational class will help you discover whether waterbirth is the right fit for you and is required if you are planning a waterbirth. Education about waterbirth itself, supplies you may need, and what you may need from your support team is included in this class. Partners are encouraged to come.        Pregnancy and obesity: Know the risks Concerned about pregnancy and obesity? Understand the risks of obesity during pregnancy -- plus steps to promote a healthy pregnancy.By [facility] Staff  Being obese during pregnancy can have a major impact on your health and your baby's health. Find out about the possible complications, recommendations for weight gain and what you can do to promote a healthy pregnancy. What's considered obese? Obesity is defined as having an excessive amount of body fat. A formula based on height and weight -- called the body mass index (BMI) -- is often used to determine if a person is obese.  BMI Weight status  Below 18.5 Underweight  18.5-24.9 Normal  25-29.9 Overweight  30 and higher Obese  40 and higher Extreme  obesity  Could obesity affect my ability to get pregnant? Being obese can harm your fertility by inhibiting normal ovulation. Obesity can also affect the outcome of in vitro fertilization (IVF). As a woman's BMI increases, so does the risk of unsuccessful IVF. How might obesity affect my pregnancy? Being obese during pregnancy increases the risk of various pregnancy complications, including: Gestational diabetes. Women who are obese are more likely to have diabetes that develops during pregnancy (gestational diabetes) than are women who have a normal weight.  Preeclampsia. Women who are obese are at increased risk of developing high blood pressure and protein in the urine after 20 weeks of pregnancy (preeclampsia).  Infection. Women who are obese during pregnancy are at increased risk of urinary tract infections. Obesity also increases the risk of postpartum infection, whether the baby is delivered vaginally or by C-section.  Thrombosis. Women who are obese during pregnancy are at increased risk of a serious condition in which a blood clot forms inside a blood vessel (thrombosis).  Obstructive sleep apnea. Women who are obese during pregnancy might  be at increased risk of a potentially serious sleep disorder in which breathing repeatedly stops and starts (obstructive sleep apnea). Pregnancy might also worsen existing obstructive sleep apnea.  Overdue pregnancy. Obesity increases the risk that pregnancy will continue beyond the expected due date.  Labor problems. Labor induction is more common in women who are obese. Obesity can also interfere with the use of certain types of pain medication, such as an epidural block.  C-section. Obesity during pregnancy increases the likelihood of elective and emergency C-sections. Obesity also increases the risk of C-section complications, such as delayed healing and wound infections. Women who are obese are also less likely to have a successful vaginal delivery after  a C-section (VBAC).  Pregnancy loss. Obesity increases the risk of miscarriage and stillbirth.  How might obesity affect my baby? Obesity during pregnancy can cause various health problems for a baby, including:  Macrosomia. Women who are obese are at increased risk of delivering an infant who is significantly larger than average (macrosomia) and has more body fat than normal. Research suggests that as birth weight increases, so does the risk of childhood obesity.  Chronic conditions. Being obese during pregnancy might increase the risk that your baby will develop heart disease or diabetes as an adult.  Birth defects. Research suggests that obesity during pregnancy slightly increases the risk of having a baby who's born with a birth defect, such as a problem with the heart or a condition affecting the brain or spinal cord (neural tube defect). How much weight should I gain during pregnancy? Your pre-pregnancy weight and body mass index (BMI), as well as your health and your baby's health, all play a role in determining how much weight you need to gain during pregnancy. Work with your health care provider to determine what's best in your case and to manage your weight throughout pregnancy.  Start by considering these general guidelines for pregnancy weight gain and obesity:  Single pregnancy. If you're obese and carrying one baby, the recommended weight gain is 11 to 20 pounds (about 5 to 9 kilograms).  Multiple pregnancy. If you're obese and carrying twins or multiples, the recommended weight gain is 25 to 42 pounds (about 11 to 19 kilograms). Still, some research suggests that women who are obese can safely gain less weight than the guidelines recommend.  Rather than gaining or losing a specific amount of weight during pregnancy, your health care provider might encourage you to focus on avoiding excessive weight gain during pregnancy.  Will I need specialized care during pregnancy? If you're obese,  your health care provider will closely monitor your pregnancy. Depending on the circumstances, your health care provider might recommend:  Early testing for gestational diabetes. For women at average risk of gestational diabetes, a screening test called the glucose challenge test is often done between weeks 24 and 28 of pregnancy. If you're obese and pregnant, your health care provider might recommend the screening test earlier in your pregnancy -- perhaps even at your first prenatal visit. If your test results are normal, you'll likely repeat the screening test between weeks 24 and 28 of pregnancy.  Delayed fetal ultrasound. Fetal ultrasound is an imaging technique that uses high-frequency sound waves to produce images of the baby in your uterus. Fetal ultrasound is typically done between weeks 18 and 20 of pregnancy to evaluate a baby's growth and development. Since ultrasound waves don't easily penetrate abdominal fat tissue, however, obesity during pregnancy can interfere with the effectiveness of fetal ultrasound.  Ultrasound results might be more detailed if the test is done a few weeks later, such as between weeks 20 and 22 of pregnancy.  Fetal echocardiography. Your health care provider might recommend a fetal ultrasound that provides a detailed picture of your baby's heart (fetal echocardiography) between weeks 20 and 22 of pregnancy. This test is used to rule out or confirm a congenital heart defect.  Frequent prenatal visits. As your pregnancy progresses, your health care provider might recommend more frequent prenatal visits to monitor your health and your baby's health. Regular fetal ultrasounds might be recommended as well. The ultrasounds can help your health care provider evaluate your baby's growth and plan for your delivery. What steps can I take to promote a healthy pregnancy? You can limit the impact of obesity on your pregnancy and ensure your health and your baby's health. For  example: Schedule a preconception appointment. If you're obese and you're considering getting pregnant, talk to your health care provider. He or she might recommend a daily prenatal vitamin and refer you to other health care providers -- such as a registered dietitian or an obesity specialist -- who can help you make changes in your lifestyle and reach a healthy weight before pregnancy.  Seek regular prenatal care. Prenatal visits can help your health care provider monitor your health and your baby's health. Tell your health care provider about any medical conditions you might have -- such as diabetes, high blood pressure or sleep apnea -- and discuss what you can do to manage them.  Eat a healthy diet. Work with your health care provider or a registered dietitian to maintain a healthy diet and avoid excessive weight gain. Keep in mind that during pregnancy, you'll need more folic ac

## 2024-01-01 NOTE — Progress Notes (Unsigned)
 [place] 12+1 wks,measurements c/w dates,FHR 150 bpm,normal ovaries,posterior placenta gr 0,NB present,NT 1.5 mm,CRL 60.96 mm

## 2024-01-02 LAB — INTEGRATED 1

## 2024-01-03 LAB — CBC/D/PLT+RPR+RH+ABO+RUBIGG...
Antibody Screen: NEGATIVE
Basophils Absolute: 0.1 10*3/uL (ref 0.0–0.2)
Basos: 1 %
EOS (ABSOLUTE): 0.1 10*3/uL (ref 0.0–0.4)
Eos: 1 %
HCV Ab: NONREACTIVE
HIV Screen 4th Generation wRfx: NONREACTIVE
Hematocrit: 40.4 % (ref 34.0–46.6)
Hemoglobin: 13.3 g/dL (ref 11.1–15.9)
Hepatitis B Surface Ag: NEGATIVE
Immature Grans (Abs): 0 10*3/uL (ref 0.0–0.1)
Immature Granulocytes: 0 %
Lymphocytes Absolute: 2.7 10*3/uL (ref 0.7–3.1)
Lymphs: 33 %
MCH: 29.6 pg (ref 26.6–33.0)
MCHC: 32.9 g/dL (ref 31.5–35.7)
MCV: 90 fL (ref 79–97)
Monocytes Absolute: 0.4 10*3/uL (ref 0.1–0.9)
Monocytes: 5 %
Neutrophils Absolute: 4.9 10*3/uL (ref 1.4–7.0)
Neutrophils: 60 %
Platelets: 256 10*3/uL (ref 150–450)
RBC: 4.49 x10E6/uL (ref 3.77–5.28)
RDW: 13.7 % (ref 11.7–15.4)
RPR Ser Ql: NONREACTIVE
Rh Factor: POSITIVE
Rubella Antibodies, IGG: 21.3 {index} (ref >0.99)
WBC: 8.1 10*3/uL (ref 3.4–10.8)

## 2024-01-03 LAB — HEMOGLOBIN A1C
Est. average glucose Bld gHb Est-mCnc: 117 mg/dL
Hgb A1c MFr Bld: 5.7 % — ABNORMAL HIGH (ref 4.8–5.6)

## 2024-01-03 LAB — INTEGRATED 1
Crown Rump Length: 61 mm
Gest. Age on Collection Date: 12.4 wk
PAPP-A Value: 464.1 ng/mL
Race: 1
Sonographer ID#: 25.8 a
Sonographer ID#: 309760
Weight: 1.5 mm
Weight: 270 [lb_av]

## 2024-01-03 LAB — URINE CULTURE

## 2024-01-03 LAB — GC/CHLAMYDIA PROBE AMP
Chlamydia trachomatis, NAA: NEGATIVE
Neisseria Gonorrhoeae by PCR: NEGATIVE

## 2024-01-03 LAB — HCV INTERPRETATION

## 2024-01-08 LAB — PANORAMA PRENATAL TEST FULL PANEL:PANORAMA TEST PLUS 5 ADDITIONAL MICRODELETIONS: FETAL FRACTION: 3.9

## 2024-01-09 ENCOUNTER — Encounter: Payer: Self-pay | Admitting: Obstetrics & Gynecology

## 2024-01-13 DIAGNOSIS — Z419 Encounter for procedure for purposes other than remedying health state, unspecified: Secondary | ICD-10-CM | POA: Diagnosis not present

## 2024-01-30 ENCOUNTER — Encounter: Payer: Self-pay | Admitting: Obstetrics & Gynecology

## 2024-02-12 DIAGNOSIS — Z419 Encounter for procedure for purposes other than remedying health state, unspecified: Secondary | ICD-10-CM | POA: Diagnosis not present

## 2024-02-23 ENCOUNTER — Ambulatory Visit

## 2024-02-23 ENCOUNTER — Encounter: Payer: Self-pay | Admitting: Obstetrics & Gynecology

## 2024-02-23 ENCOUNTER — Ambulatory Visit: Admitting: Obstetrics & Gynecology

## 2024-02-23 VITALS — BP 114/73 | HR 86 | Wt 270.0 lb

## 2024-02-23 DIAGNOSIS — Z3480 Encounter for supervision of other normal pregnancy, unspecified trimester: Secondary | ICD-10-CM

## 2024-02-23 DIAGNOSIS — Z3A19 19 weeks gestation of pregnancy: Secondary | ICD-10-CM | POA: Diagnosis not present

## 2024-02-23 DIAGNOSIS — Z363 Encounter for antenatal screening for malformations: Secondary | ICD-10-CM

## 2024-02-23 DIAGNOSIS — Z3482 Encounter for supervision of other normal pregnancy, second trimester: Secondary | ICD-10-CM | POA: Diagnosis not present

## 2024-02-23 NOTE — Progress Notes (Signed)
   LOW-RISK PREGNANCY VISIT Patient name: Yvonne Ayala MRN [id number]  Date of birth: [birth year] Chief Complaint:   Routine Prenatal Visit (Anatomy scan/)  History of Present Illness:   Yvonne Ayala is a 23 y.o. G44P2002 female at [redacted]w[redacted]d with an Estimated Date of Delivery: 07/14/24 being seen today for ongoing management of a low-risk pregnancy.     01/01/2024   10:47 AM 08/14/2020   11:27 AM 10/16/2017    2:22 PM 09/07/2017   10:52 AM  Depression screen PHQ 2/9  Decreased Interest 0 0 0 0  Down, Depressed, Hopeless 0 0 0 0  PHQ - 2 Score 0 0 0 0  Altered sleeping 1 1 0 0  Tired, decreased energy 1 0 0 0  Change in appetite 1 1 0 0  Feeling bad or failure about yourself  0 0 0 0  Trouble concentrating 0 0 0 0  Moving slowly or fidgety/restless 0 0 0 0  Suicidal thoughts 0 0 0 0  PHQ-9 Score 3 2 0 0    Today she reports no complaints. Contractions: Not present. Vag. Bleeding: None.  Movement: Absent. denies leaking of fluid. Review of Systems:   Pertinent items are noted in HPI Denies abnormal vaginal discharge w/ itching/odor/irritation, headaches, visual changes, shortness of breath, chest pain, abdominal pain, severe nausea/vomiting, or problems with urination or bowel movements unless otherwise stated above. Pertinent History Reviewed:  Reviewed past medical,surgical, social, obstetrical and family history.  Reviewed problem list, medications and allergies. Physical Assessment:   Vitals:   02/23/24 1004  BP: 114/73  Pulse: 86  Weight: 270 lb (122.5 kg)  Body mass index is 40.46 kg/m.        Physical Examination:   General appearance: Well appearing, and in no distress  Mental status: Alert, oriented to person, place, and time  Skin: Warm & dry  Cardiovascular: Normal heart rate noted  Respiratory: Normal respiratory effort, no distress  Abdomen: Soft, gravid, nontender  Pelvic: Cervical exam deferred         Extremities: Edema: None  Fetal Status:      Movement: Absent    Chaperone: n/a    No results found for this or any previous visit (from the past 24 hours).  Assessment & Plan:  1) Low-risk pregnancy G3P2002 at [redacted]w[redacted]d with an Estimated Date of Delivery: 07/14/24   2) normal sonogram today,    Meds: No orders of the defined types were placed in this encounter.  Labs/procedures today:   Plan:  Continue routine obstetrical care  Next visit: prefers in person      Follow-up: Return in about 4 weeks (around 03/22/2024) for LROB, MyChart Connect visit.  No orders of the defined types were placed in this encounter.   Wendelyn Halter, MD 02/23/2024 10:54 AM

## 2024-02-23 NOTE — Progress Notes (Addendum)
 US  19+5 wks,breech,posterior fundal placenta gr 0,CX 6.1 cm,SVP of fluid 5 cm,FHR 142 bpm,EFW 357 g 85%,anatomy complete,no obvious abnormalities

## 2024-03-14 DIAGNOSIS — Z419 Encounter for procedure for purposes other than remedying health state, unspecified: Secondary | ICD-10-CM | POA: Diagnosis not present

## 2024-03-25 ENCOUNTER — Telehealth: Admitting: Women's Health

## 2024-03-25 ENCOUNTER — Encounter: Payer: Self-pay | Admitting: Women's Health

## 2024-03-25 VITALS — BP 103/64 | HR 78

## 2024-03-25 DIAGNOSIS — Z3A24 24 weeks gestation of pregnancy: Secondary | ICD-10-CM

## 2024-03-25 DIAGNOSIS — Z6841 Body Mass Index (BMI) 40.0 and over, adult: Secondary | ICD-10-CM

## 2024-03-25 DIAGNOSIS — O0992 Supervision of high risk pregnancy, unspecified, second trimester: Secondary | ICD-10-CM

## 2024-03-25 NOTE — Progress Notes (Signed)
 TELEHEALTH VIRTUAL OBSTETRICS VISIT ENCOUNTER NOTE Patient name: Yvonne Ayala MRN [id number]  Date of birth: [birth year]  I connected with patient on 03/25/24 at  8:50 AM EDT by MyChart video  and verified that I am speaking with the correct person using two identifiers. Pt is not currently in our office, she is at home.  The provider is in the office.    I discussed the limitations, risks, security and privacy concerns of performing an evaluation and management service by telephone and the availability of in person appointments. I also discussed with the patient that there may be a patient responsible charge related to this service. The patient expressed understanding and agreed to proceed.  Chief Complaint:   Routine Prenatal Visit  History of Present Illness:   Yvonne Ayala is a 23 y.o. 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     01/01/2024   10:47 AM 08/14/2020   11:27 AM 10/16/2017    2:22 PM 09/07/2017   10:52 AM  Depression screen PHQ 2/9  Decreased Interest 0 0 0 0  Down, Depressed, Hopeless 0 0 0 0  PHQ - 2 Score 0 0 0 0  Altered sleeping 1 1 0 0  Tired, decreased energy 1 0 0 0  Change in appetite 1 1 0 0  Feeling bad or failure about yourself  0 0 0 0  Trouble concentrating 0 0 0 0  Moving slowly or fidgety/restless 0 0 0 0  Suicidal thoughts 0 0 0 0  PHQ-9 Score 3 2 0 0    Today she reports no complaints. Contractions: Not present. Vag. Bleeding: None.  Movement: Present. denies leaking of fluid. Review of Systems:   Pertinent items are noted in HPI Denies abnormal vaginal discharge w/ itching/odor/irritation, headaches, visual changes, shortness of breath, chest pain, abdominal pain, severe nausea/vomiting, or problems with urination or bowel movements unless otherwise stated above. Pertinent History Reviewed:  Reviewed past  medical,surgical, social, obstetrical and family history.  Reviewed problem list, medications and allergies. Physical Assessment:   Vitals:   03/25/24 0852  BP: 103/64  Pulse: 78  There is no height or weight on file to calculate BMI.        Physical Examination:   General:  Alert, oriented and cooperative.   Mental Status: Normal mood and affect perceived. Normal judgment and thought content.  Rest of physical exam deferred due to type of encounter  No results found for this or any previous visit (from the past 24 hours).  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   Labs/procedures today: none  Plan:  Continue routine obstetrical care.  Does have home bp cuff. Office bp cuff given: not applicable. Check bp weekly, let us  know if consistently >140 and/or >90.  Next visit: prefers will be in person for pn2, pap, EFW u/s    Reviewed: Preterm labor symptoms and general obstetric precautions including but not limited to vaginal bleeding, contractions, leaking of fluid and fetal movement were reviewed in detail with the patient. The patient was advised to call back or seek an in-person office evaluation/go to  MAU at [facility] for any urgent or concerning symptoms. All questions were answered. Please refer to After Visit Summary for other counseling recommendations.    I provided 10 minutes of non-face-to-face time during this encounter.  Follow-up: Return in about 4 weeks (around 04/22/2024) for HROB, pap, PN2, US :EFW, MD or CNM.  Orders Placed This Encounter  Procedures   US  OB Follow Up   Suzen JONELLE Fetters CNM, [facility] 03/25/2024 9:08 AM

## 2024-03-25 NOTE — Patient Instructions (Signed)
 Yvonne Ayala, thank you for choosing our office today! We appreciate the opportunity to meet your healthcare needs. You may receive a short survey by mail, e-mail, or through [employer]. If you are happy with your care we would appreciate if you could take just a few minutes to complete the survey questions. We read all of your comments and take your feedback very seriously. Thank you again for choosing our office.  Center for [employer] Team at [facility]  [facility] at [facility] ([address] [place], [place] [address]) Entrance C, located off of [address] Free 24/7 valet parking   You will have your sugar test next visit.  Please do not eat or drink anything after midnight the night before you come, not even water.  You will be here for at least two hours.  Please make an appointment online for the bloodwork at [email or web address] for 8:00am (or as close to this as possible). Make sure you select the [facility] service center.   CLASSES: Go to [email or web address] to register for classes (childbirth, breastfeeding, waterbirth, infant CPR, daddy bootcamp, etc.)  Call the office [phone]) or go to [facility] if: You begin to have strong, frequent contractions Your water breaks.  Sometimes it is a big gush of fluid, sometimes it is just a trickle that keeps getting your panties wet or running down your legs You have vaginal bleeding.  It is normal to have a small amount of spotting if your cervix was checked.  You don't feel your baby moving like normal.  If you don't, get you something to eat and drink and lay down and focus on feeling your baby move.   If your baby is still not moving like normal, you should call the office or go to [facility].  Call the office [phone]) or go to [facility] hospital for these signs of pre-eclampsia: Severe headache that does not go away with Tylenol  Visual changes- seeing spots, double, blurred vision Pain under your right breast or upper  abdomen that does not go away with Tums or heartburn medicine Nausea and/or vomiting Severe swelling in your hands, feet, and face    [place] [facility]/Family Doctors [facility]): [address] Luba BROCKS, [phone]           [facility]: [address], [phone]                [facility]): [address], [phone]  [facility] Department: [address], [place], [phone]    [facility] Pediatricians/Family Doctors [facility]): [address], [phone] [facility]: [address] [place], [phone] [facility]: [address], [phone]  [facility] Doctors  [facility]): [phone] [facility]: [address] [phone]   [facility] Doctors [facility]: [address] [phone]  [facility] Doctors  [facility]: [phone], [phone]  Home Blood Pressure Monitoring for Patients   Your provider has recommended that you check your blood pressure (BP) at least once a week at home. If you do not have a blood pressure cuff at home, one will be provided for you. Contact your provider if you have not received your monitor within 1 week.   Helpful Tips for Accurate Home Blood Pressure Checks  Don't smoke, exercise, or drink caffeine 30 minutes before checking  your BP Use the restroom before checking your BP (a full bladder can raise your pressure) Relax in a comfortable upright chair Feet on the ground Left arm resting comfortably on a flat surface at the level of your heart Legs uncrossed Back supported Sit quietly and don't talk Place the cuff on your bare arm Adjust snuggly, so that only two fingertips can fit between your skin and the top of the cuff Check 2 readings separated by at least one  minute Keep a log of your BP readings For a visual, please reference this diagram: http://ccnc.care/bpdiagram  Provider Name: [facility]     Phone: [phone]  Zone 1: ALL CLEAR  Continue to monitor your symptoms:  BP reading is less than 140 (top number) or less than 90 (bottom number)  No right upper stomach pain No headaches or seeing spots No feeling nauseated or throwing up No swelling in face and hands  Zone 2: CAUTION Call your doctor's office for any of the following:  BP reading is greater than 140 (top number) or greater than 90 (bottom number)  Stomach pain under your ribs in the middle or right side Headaches or seeing spots Feeling nauseated or throwing up Swelling in face and hands  Zone 3: EMERGENCY  Seek immediate medical care if you have any of the following:  BP reading is greater than160 (top number) or greater than 110 (bottom number) Severe headaches not improving with Tylenol  Serious difficulty catching your breath Any worsening symptoms from Zone 2   Second Trimester of Pregnancy The second trimester is from week 13 through week 28, months 4 through 6. The second trimester is often a time when you feel your best. Your body has also adjusted to being pregnant, and you begin to feel better physically. Usually, morning sickness has lessened or quit completely, you may have more energy, and you may have an increase in appetite. The second trimester is also a time when the fetus is growing rapidly. At the end of the sixth month, the fetus is about 9 inches long and weighs about 1 pounds. You will likely begin to feel the baby move (quickening) between 18 and 20 weeks of the pregnancy. BODY CHANGES Your body goes through many changes during pregnancy. The changes vary from woman to woman.  Your weight will continue to increase. You will notice your lower abdomen bulging out. You may begin to get stretch marks on your hips, abdomen, and breasts. You may  develop headaches that can be relieved by medicines approved by your health care provider. You may urinate more often because the fetus is pressing on your bladder. You may develop or continue to have heartburn as a result of your pregnancy. You may develop constipation because certain hormones are causing the muscles that push waste through your intestines to slow down. You may develop hemorrhoids or swollen, bulging veins (varicose veins). You may have back pain because of the weight gain and pregnancy hormones relaxing your joints between the bones in your pelvis and as a result of a shift in weight and the muscles that support your balance. Your breasts will continue to grow and be tender. Your gums may bleed and may be sensitive to brushing and flossing. Dark spots or blotches (chloasma, mask of pregnancy) may develop on your face. This will likely fade after the baby is born. A dark line from your belly button to the pubic area (linea nigra) may appear. This will likely fade after the  baby is born. You may have changes in your hair. These can include thickening of your hair, rapid growth, and changes in texture. Some women also have hair loss during or after pregnancy, or hair that feels dry or thin. Your hair will most likely return to normal after your baby is born. WHAT TO EXPECT AT YOUR PRENATAL VISITS During a routine prenatal visit: You will be weighed to make sure you and the fetus are growing normally. Your blood pressure will be taken. Your abdomen will be measured to track your baby's growth. The fetal heartbeat will be listened to. Any test results from the previous visit will be discussed. Your health care provider may ask you: How you are feeling. If you are feeling the baby move. If you have had any abnormal symptoms, such as leaking fluid, bleeding, severe headaches, or abdominal cramping. If you have any questions. Other tests that may be performed during your second  trimester include: Blood tests that check for: Low iron levels (anemia). Gestational diabetes (between 24 and 28 weeks). Rh antibodies. Urine tests to check for infections, diabetes, or protein in the urine. An ultrasound to confirm the proper growth and development of the baby. An amniocentesis to check for possible genetic problems. Fetal screens for spina bifida and Down syndrome. HOME CARE INSTRUCTIONS  Avoid all smoking, herbs, alcohol, and unprescribed drugs. These chemicals affect the formation and growth of the baby. Follow your health care provider's instructions regarding medicine use. There are medicines that are either safe or unsafe to take during pregnancy. Exercise only as directed by your health care provider. Experiencing uterine cramps is a good sign to stop exercising. Continue to eat regular, healthy meals. Wear a good support bra for breast tenderness. Do not use hot tubs, steam rooms, or saunas. Wear your seat belt at all times when driving. Avoid raw meat, uncooked cheese, cat litter boxes, and soil used by cats. These carry germs that can cause birth defects in the baby. Take your prenatal vitamins. Try taking a stool softener (if your health care provider approves) if you develop constipation. Eat more high-fiber foods, such as fresh vegetables or fruit and whole grains. Drink plenty of fluids to keep your urine clear or pale yellow. Take warm sitz baths to soothe any pain or discomfort caused by hemorrhoids. Use hemorrhoid cream if your health care provider approves. If you develop varicose veins, wear support hose. Elevate your feet for 15 minutes, 3-4 times a day. Limit salt in your diet. Avoid heavy lifting, wear low heel shoes, and practice good posture. Rest with your legs elevated if you have leg cramps or low back pain. Visit your dentist if you have not gone yet during your pregnancy. Use a soft toothbrush to brush your teeth and be gentle when you floss. A  sexual relationship may be continued unless your health care provider directs you otherwise. Continue to go to all your prenatal visits as directed by your health care provider. SEEK MEDICAL CARE IF:  You have dizziness. You have mild pelvic cramps, pelvic pressure, or nagging pain in the abdominal area. You have persistent nausea, vomiting, or diarrhea. You have a bad smelling vaginal discharge. You have pain with urination. SEEK IMMEDIATE MEDICAL CARE IF:  You have a fever. You are leaking fluid from your vagina. You have spotting or bleeding from your vagina. You have severe abdominal cramping or pain. You have rapid weight gain or loss. You have shortness of breath with chest pain. You  notice sudden or extreme swelling of your face, hands, ankles, feet, or legs. You have not felt your baby move in over an hour. You have severe headaches that do not go away with medicine. You have vision changes. Document Released: 09/13/2001 Document Revised: 09/24/2013 Document Reviewed: 11/20/2012 [facility] Patient Information 2015 [place], [place]. This information is not intended to replace advice given to you by your health care provider. Make sure you discuss any questions you have with your health care provider.

## 2024-04-09 ENCOUNTER — Other Ambulatory Visit: Payer: Self-pay

## 2024-04-09 DIAGNOSIS — O0992 Supervision of high risk pregnancy, unspecified, second trimester: Secondary | ICD-10-CM

## 2024-04-09 DIAGNOSIS — Z3A28 28 weeks gestation of pregnancy: Secondary | ICD-10-CM

## 2024-04-09 DIAGNOSIS — Z131 Encounter for screening for diabetes mellitus: Secondary | ICD-10-CM

## 2024-04-13 DIAGNOSIS — Z419 Encounter for procedure for purposes other than remedying health state, unspecified: Secondary | ICD-10-CM | POA: Diagnosis not present

## 2024-04-24 ENCOUNTER — Other Ambulatory Visit

## 2024-04-24 ENCOUNTER — Encounter: Payer: Self-pay | Admitting: Women's Health

## 2024-04-24 ENCOUNTER — Ambulatory Visit

## 2024-04-24 ENCOUNTER — Ambulatory Visit: Admitting: Women's Health

## 2024-04-24 VITALS — BP 98/67 | HR 93 | Wt 272.0 lb

## 2024-04-24 DIAGNOSIS — Z3A28 28 weeks gestation of pregnancy: Secondary | ICD-10-CM

## 2024-04-24 DIAGNOSIS — Z6841 Body Mass Index (BMI) 40.0 and over, adult: Secondary | ICD-10-CM

## 2024-04-24 DIAGNOSIS — O0993 Supervision of high risk pregnancy, unspecified, third trimester: Secondary | ICD-10-CM | POA: Diagnosis not present

## 2024-04-24 DIAGNOSIS — O0992 Supervision of high risk pregnancy, unspecified, second trimester: Secondary | ICD-10-CM

## 2024-04-24 DIAGNOSIS — O0990 Supervision of high risk pregnancy, unspecified, unspecified trimester: Secondary | ICD-10-CM

## 2024-04-24 NOTE — Progress Notes (Signed)
 US  28+3 wks,cephalic,cx 3.1 cm,posterior placenta gr 0,AFI 12 cm,normal ovaries,FHR 140 bpm,EFW 1347 g 65%

## 2024-04-24 NOTE — Progress Notes (Signed)
 HIGH-RISK PREGNANCY VISIT Patient name: Yvonne Ayala MRN [id number]  Date of birth: 2001/02/05 Chief Complaint:   Routine Prenatal Visit  History of Present Illness:   Yvonne Ayala is a 23 y.o. G75P2002 female at [redacted]w[redacted]d with an Estimated Date of Delivery: 07/14/24 being seen today for ongoing management of a high-risk pregnancy complicated by PG BMI 40.    Today she reports pelvic pressure. Contractions: Not present. Vag. Bleeding: None.  Movement: Present. denies leaking of fluid.      01/01/2024   10:47 AM 08/14/2020   11:27 AM 10/16/2017    2:22 PM 09/07/2017   10:52 AM  Depression screen PHQ 2/9  Decreased Interest 0 0 0 0  Down, Depressed, Hopeless 0 0 0 0  PHQ - 2 Score 0 0 0 0  Altered sleeping 1 1 0 0  Tired, decreased energy 1 0 0 0  Change in appetite 1 1 0 0  Feeling bad or failure about yourself  0 0 0 0  Trouble concentrating 0 0 0 0  Moving slowly or fidgety/restless 0 0 0 0  Suicidal thoughts 0 0 0 0  PHQ-9 Score 3 2 0 0        01/01/2024   10:47 AM 08/14/2020   11:28 AM  GAD 7 : Generalized Anxiety Score  Nervous, Anxious, on Edge 0 0  Control/stop worrying 0 0  Worry too much - different things 1 0  Trouble relaxing 0 0  Restless 0 0  Easily annoyed or irritable 2 0  Afraid - awful might happen 0 0  Total GAD 7 Score 3 0     Review of Systems:   Pertinent items are noted in HPI Denies abnormal vaginal discharge w/ itching/odor/irritation, headaches, visual changes, shortness of breath, chest pain, abdominal pain, severe nausea/vomiting, or problems with urination or bowel movements unless otherwise stated above. Pertinent History Reviewed:  Reviewed past medical,surgical, social, obstetrical and family history.  Reviewed problem list, medications and allergies. Physical Assessment:   Vitals:   04/24/24 1027  BP: 98/67  Pulse: 93  Weight: 272 lb (123.4 kg)  Body mass index is 40.76 kg/m.           Physical Examination:   General  appearance: alert, well appearing, and in no distress  Mental status: alert, oriented to person, place, and time  Skin: warm & dry   Extremities:      Cardiovascular: normal heart rate noted  Respiratory: normal respiratory effort, no distress  Abdomen: gravid, soft, non-tender  Pelvic: Cervical exam deferred         Fetal Status:     Movement: Present    Fetal Surveillance Testing today: US  28+3 wks,cephalic,cx 3.1 cm,posterior placenta gr 0,AFI 12 cm,normal ovaries,FHR 140 bpm,EFW 1347 g 65%   Chaperone: N/A  No results found for this or any previous visit (from the past 24 hours).  Assessment & Plan:  High-risk pregnancy: G3P2002 at [redacted]w[redacted]d with an Estimated Date of Delivery: 07/14/24   1) PGBMI 40, stable, EFW tdoay 65%  2) Pressure> can try belt/tape  Meds: No orders of the defined types were placed in this encounter.  Labs/procedures today: U/S, PN2, and declines tdap today, maybe next visit  Treatment Plan:  U/S q4wks   2x/wk nst or weekly BPP @ 34-36wks   Deliver @ 39-40.6wks   Reviewed: Preterm labor symptoms and general obstetric precautions including but not limited to vaginal bleeding, contractions, leaking of fluid and fetal movement were  reviewed in detail with the patient.  All questions were answered. Does have home bp cuff. Office bp cuff given: not applicable. Check bp weekly, let us  know if consistently >140 and/or >90.  Follow-up: Return in about 4 weeks (around 05/22/2024) for HROB w/ pap, US :EFW, CNM, in person.   No future appointments.  No orders of the defined types were placed in this encounter.  Suzen JONELLE Fetters CNM, [facility] 04/24/2024 10:51 AM

## 2024-04-24 NOTE — Patient Instructions (Signed)
 Yvonne Ayala, thank you for choosing our office today! We appreciate the opportunity to meet your healthcare needs. You may receive a short survey by mail, e-mail, or through [employer]. If you are happy with your care we would appreciate if you could take just a few minutes to complete the survey questions. We read all of your comments and take your feedback very seriously. Thank you again for choosing our office.  Center for [employer] Team at [facility]  [facility] at [facility] ([address] [place], [place] [address]) Entrance C, located off of [address] Free 24/7 valet parking   CLASSES: Go to [email or web address] to register for classes (childbirth, breastfeeding, waterbirth, infant CPR, daddy bootcamp, etc.)  Call the office [phone]) or go to [facility] if: You begin to have strong, frequent contractions Your water breaks.  Sometimes it is a big gush of fluid, sometimes it is just a trickle that keeps getting your panties wet or running down your legs You have vaginal bleeding.  It is normal to have a small amount of spotting if your cervix was checked.  You don't feel your baby moving like normal.  If you don't, get you something to eat and drink and lay down and focus on feeling your baby move.   If your baby is still not moving like normal, you should call the office or go to [facility].  Call the office [phone]) or go to [facility] hospital for these signs of pre-eclampsia: Severe headache that does not go away with Tylenol Visual changes- seeing spots, double, blurred vision Pain under your right breast or upper abdomen that does not go away with Tums or heartburn medicine Nausea and/or vomiting Severe swelling in your hands, feet, and face   Tdap Vaccine It is recommended that you get the Tdap vaccine during the third trimester of EACH pregnancy to help protect your baby from getting pertussis (whooping cough) 27-36 weeks is the BEST time to do  this so that you can pass the protection on to your baby. During pregnancy is better than after pregnancy, but if you are unable to get it during pregnancy it will be offered at the hospital.  You can get this vaccine with [place], at the health department, your family doctor, or some local pharmacies Everyone who will be around your baby should also be up-to-date on their vaccines before the baby comes. Adults (who are not pregnant) only need 1 dose of Tdap during adulthood.   [facility] Pediatricians/Family Doctors [facility]): [address] Colette Ribas, [phone]           [facility]: [address], [phone]                [facility]): [address], [phone] (call to ask if accepting patients) [facility] Department: [address], [place], [phone]    [facility] Pediatricians/Family Doctors [facility]): [phone] [address], [phone] [facility]: [address] [place], [phone] [facility]: [address], [phone]  [facility] Doctors  [facility]): [phone] [facility]: [address], [phone]   [facility] Doctors [facility]: [address], [phone]  [facility] Family Doctors  [facility]: [phone], [phone]  Home Blood Pressure Monitoring for Patients   Your provider has recommended that you check your  blood pressure (BP) at least once a week at home. If you do not have a blood pressure cuff at home, one will be provided for you. Contact your provider if you have not received your monitor within 1 week.   Helpful Tips for Accurate Home Blood Pressure Checks  Don't smoke, exercise, or drink caffeine 30 minutes before checking your BP Use the restroom before checking your BP (a full bladder can raise your  pressure) Relax in a comfortable upright chair Feet on the ground Left arm resting comfortably on a flat surface at the level of your heart Legs uncrossed Back supported Sit quietly and don't talk Place the cuff on your bare arm Adjust snuggly, so that only two fingertips can fit between your skin and the top of the cuff Check 2 readings separated by at least one minute Keep a log of your BP readings For a visual, please reference this diagram: http://ccnc.care/bpdiagram  Provider Name: [facility]     Phone: [phone]  Zone 1: ALL CLEAR  Continue to monitor your symptoms:  BP reading is less than 140 (top number) or less than 90 (bottom number)  No right upper stomach pain No headaches or seeing spots No feeling nauseated or throwing up No swelling in face and hands  Zone 2: CAUTION Call your doctor's office for any of the following:  BP reading is greater than 140 (top number) or greater than 90 (bottom number)  Stomach pain under your ribs in the middle or right side Headaches or seeing spots Feeling nauseated or throwing up Swelling in face and hands  Zone 3: EMERGENCY  Seek immediate medical care if you have any of the following:  BP reading is greater than160 (top number) or greater than 110 (bottom number) Severe headaches not improving with Tylenol Serious difficulty catching your breath Any worsening symptoms from Zone 2   Third Trimester of Pregnancy The third trimester is from week 29 through week 42, months 7 through 9. The third trimester is a time when the fetus is growing rapidly. At the end of the ninth month, the fetus is about 20 inches in length and weighs 6-10 pounds.  BODY CHANGES Your body goes through many changes during pregnancy. The changes vary from woman to woman.  Your weight will continue to increase. You can expect to gain 25-35 pounds (11-16 kg) by the end of the pregnancy. You may begin to get stretch marks on your hips, abdomen,  and breasts. You may urinate more often because the fetus is moving lower into your pelvis and pressing on your bladder. You may develop or continue to have heartburn as a result of your pregnancy. You may develop constipation because certain hormones are causing the muscles that push waste through your intestines to slow down. You may develop hemorrhoids or swollen, bulging veins (varicose veins). You may have pelvic pain because of the weight gain and pregnancy hormones relaxing your joints between the bones in your pelvis. Backaches may result from overexertion of the muscles supporting your posture. You may have changes in your hair. These can include thickening of your hair, rapid growth, and changes in texture. Some women also have hair loss during or after pregnancy, or hair that feels dry or thin. Your hair will most likely return to normal after your baby is born. Your breasts will continue to grow and be tender. A yellow discharge may leak from your breasts called colostrum. Your belly button may stick out. You may  feel short of breath because of your expanding uterus. You may notice the fetus "dropping," or moving lower in your abdomen. You may have a bloody mucus discharge. This usually occurs a few days to a week before labor begins. Your cervix becomes thin and soft (effaced) near your due date. WHAT TO EXPECT AT YOUR PRENATAL EXAMS  You will have prenatal exams every 2 weeks until week 36. Then, you will have weekly prenatal exams. During a routine prenatal visit: You will be weighed to make sure you and the fetus are growing normally. Your blood pressure is taken. Your abdomen will be measured to track your baby's growth. The fetal heartbeat will be listened to. Any test results from the previous visit will be discussed. You may have a cervical check near your due date to see if you have effaced. At around 36 weeks, your caregiver will check your cervix. At the same time, your  caregiver will also perform a test on the secretions of the vaginal tissue. This test is to determine if a type of bacteria, Group B streptococcus, is present. Your caregiver will explain this further. Your caregiver may ask you: What your birth plan is. How you are feeling. If you are feeling the baby move. If you have had any abnormal symptoms, such as leaking fluid, bleeding, severe headaches, or abdominal cramping. If you have any questions. Other tests or screenings that may be performed during your third trimester include: Blood tests that check for low iron levels (anemia). Fetal testing to check the health, activity level, and growth of the fetus. Testing is done if you have certain medical conditions or if there are problems during the pregnancy. FALSE LABOR You may feel small, irregular contractions that eventually go away. These are called Braxton Hicks contractions, or false labor. Contractions may last for hours, days, or even weeks before true labor sets in. If contractions come at regular intervals, intensify, or become painful, it is best to be seen by your caregiver.  SIGNS OF LABOR  Menstrual-like cramps. Contractions that are 5 minutes apart or less. Contractions that start on the top of the uterus and spread down to the lower abdomen and back. A sense of increased pelvic pressure or back pain. A watery or bloody mucus discharge that comes from the vagina. If you have any of these signs before the 37th week of pregnancy, call your caregiver right away. You need to go to the hospital to get checked immediately. HOME CARE INSTRUCTIONS  Avoid all smoking, herbs, alcohol, and unprescribed drugs. These chemicals affect the formation and growth of the baby. Follow your caregiver's instructions regarding medicine use. There are medicines that are either safe or unsafe to take during pregnancy. Exercise only as directed by your caregiver. Experiencing uterine cramps is a good sign to  stop exercising. Continue to eat regular, healthy meals. Wear a good support bra for breast tenderness. Do not use hot tubs, steam rooms, or saunas. Wear your seat belt at all times when driving. Avoid raw meat, uncooked cheese, cat litter boxes, and soil used by cats. These carry germs that can cause birth defects in the baby. Take your prenatal vitamins. Try taking a stool softener (if your caregiver approves) if you develop constipation. Eat more high-fiber foods, such as fresh vegetables or fruit and whole grains. Drink plenty of fluids to keep your urine clear or pale yellow. Take warm sitz baths to soothe any pain or discomfort caused by hemorrhoids. Use hemorrhoid cream if  your caregiver approves. If you develop varicose veins, wear support hose. Elevate your feet for 15 minutes, 3-4 times a day. Limit salt in your diet. Avoid heavy lifting, wear low heal shoes, and practice good posture. Rest a lot with your legs elevated if you have leg cramps or low back pain. Visit your dentist if you have not gone during your pregnancy. Use a soft toothbrush to brush your teeth and be gentle when you floss. A sexual relationship may be continued unless your caregiver directs you otherwise. Do not travel far distances unless it is absolutely necessary and only with the approval of your caregiver. Take prenatal classes to understand, practice, and ask questions about the labor and delivery. Make a trial run to the hospital. Pack your hospital bag. Prepare the baby's nursery. Continue to go to all your prenatal visits as directed by your caregiver. SEEK MEDICAL CARE IF: You are unsure if you are in labor or if your water has broken. You have dizziness. You have mild pelvic cramps, pelvic pressure, or nagging pain in your abdominal area. You have persistent nausea, vomiting, or diarrhea. You have a bad smelling vaginal discharge. You have pain with urination. SEEK IMMEDIATE MEDICAL CARE IF:  You  have a fever. You are leaking fluid from your vagina. You have spotting or bleeding from your vagina. You have severe abdominal cramping or pain. You have rapid weight loss or gain. You have shortness of breath with chest pain. You notice sudden or extreme swelling of your face, hands, ankles, feet, or legs. You have not felt your baby move in over an hour. You have severe headaches that do not go away with medicine. You have vision changes. Document Released: 09/13/2001 Document Revised: 09/24/2013 Document Reviewed: 11/20/2012 [facility] Patient Information 2015 [place], [place]. This information is not intended to replace advice given to you by your health care provider. Make sure you discuss any questions you have with your health care provider.

## 2024-04-25 ENCOUNTER — Ambulatory Visit: Payer: Self-pay | Admitting: Women's Health

## 2024-04-25 DIAGNOSIS — O0990 Supervision of high risk pregnancy, unspecified, unspecified trimester: Secondary | ICD-10-CM

## 2024-04-25 LAB — CBC
Hematocrit: 37.1 % (ref 34.0–46.6)
Hemoglobin: 11.8 g/dL (ref 11.1–15.9)
MCH: 28.9 pg (ref 26.6–33.0)
MCHC: 31.8 g/dL (ref 31.5–35.7)
MCV: 91 fL (ref 79–97)
Platelets: 243 x10E3/uL (ref 150–450)
RBC: 4.09 x10E6/uL (ref 3.77–5.28)
RDW: 13 % (ref 11.7–15.4)
WBC: 8.6 x10E3/uL (ref 3.4–10.8)

## 2024-04-25 LAB — GLUCOSE TOLERANCE, 2 HOURS W/ 1HR
Glucose, 1 hour: 159 mg/dL (ref 70–179)
Glucose, 2 hour: 113 mg/dL (ref 70–152)
Glucose, Fasting: 87 mg/dL (ref 70–91)

## 2024-04-25 LAB — ANTIBODY SCREEN: Antibody Screen: NEGATIVE

## 2024-04-25 LAB — RPR: RPR Ser Ql: NONREACTIVE

## 2024-04-25 LAB — HIV ANTIBODY (ROUTINE TESTING W REFLEX): HIV Screen 4th Generation wRfx: NONREACTIVE

## 2024-05-14 DIAGNOSIS — Z419 Encounter for procedure for purposes other than remedying health state, unspecified: Secondary | ICD-10-CM | POA: Diagnosis not present

## 2024-05-16 ENCOUNTER — Encounter: Payer: Self-pay | Admitting: Obstetrics & Gynecology

## 2024-05-27 ENCOUNTER — Encounter: Payer: Self-pay | Admitting: Obstetrics & Gynecology

## 2024-05-29 ENCOUNTER — Other Ambulatory Visit (HOSPITAL_COMMUNITY)
Admission: RE | Admit: 2024-05-29 | Discharge: 2024-05-29 | Disposition: A | Discharge status: Home / Self Care | Source: Ambulatory Visit | Attending: Women's Health | Admitting: Women's Health

## 2024-05-29 ENCOUNTER — Other Ambulatory Visit

## 2024-05-29 ENCOUNTER — Ambulatory Visit

## 2024-05-29 ENCOUNTER — Ambulatory Visit: Admitting: Advanced Practice Midwife

## 2024-05-29 VITALS — BP 104/71 | HR 66 | Wt 275.0 lb

## 2024-05-29 DIAGNOSIS — Z23 Encounter for immunization: Secondary | ICD-10-CM

## 2024-05-29 DIAGNOSIS — Z124 Encounter for screening for malignant neoplasm of cervix: Secondary | ICD-10-CM | POA: Diagnosis not present

## 2024-05-29 DIAGNOSIS — Z1151 Encounter for screening for human papillomavirus (HPV): Secondary | ICD-10-CM | POA: Insufficient documentation

## 2024-05-29 DIAGNOSIS — Z3A33 33 weeks gestation of pregnancy: Secondary | ICD-10-CM

## 2024-05-29 DIAGNOSIS — O0992 Supervision of high risk pregnancy, unspecified, second trimester: Secondary | ICD-10-CM

## 2024-05-29 DIAGNOSIS — Z6841 Body Mass Index (BMI) 40.0 and over, adult: Secondary | ICD-10-CM

## 2024-05-29 DIAGNOSIS — O0993 Supervision of high risk pregnancy, unspecified, third trimester: Secondary | ICD-10-CM | POA: Diagnosis not present

## 2024-05-29 DIAGNOSIS — O0990 Supervision of high risk pregnancy, unspecified, unspecified trimester: Secondary | ICD-10-CM

## 2024-05-29 DIAGNOSIS — Z01419 Encounter for gynecological examination (general) (routine) without abnormal findings: Secondary | ICD-10-CM | POA: Diagnosis present

## 2024-05-29 NOTE — Patient Instructions (Signed)
 Yvonne Ayala, thank you for choosing our office today! We appreciate the opportunity to meet your healthcare needs. You may receive a short survey by mail, e-mail, or through [employer]. If you are happy with your care we would appreciate if you could take just a few minutes to complete the survey questions. We read all of your comments and take your feedback very seriously. Thank you again for choosing our office.  Center for [employer] Team at [facility]  [facility] at [facility] ([address] [place], [place] [address]) Entrance C, located off of [address] Free 24/7 valet parking   CLASSES: Go to [email or web address] to register for classes (childbirth, breastfeeding, waterbirth, infant CPR, daddy bootcamp, etc.)  Call the office [phone]) or go to [facility] if: You begin to have strong, frequent contractions Your water breaks.  Sometimes it is a big gush of fluid, sometimes it is just a trickle that keeps getting your panties wet or running down your legs You have vaginal bleeding.  It is normal to have a small amount of spotting if your cervix was checked.  You don't feel your baby moving like normal.  If you don't, get you something to eat and drink and lay down and focus on feeling your baby move.   If your baby is still not moving like normal, you should call the office or go to [facility].  Call the office [phone]) or go to [facility] hospital for these signs of pre-eclampsia: Severe headache that does not go away with Tylenol Visual changes- seeing spots, double, blurred vision Pain under your right breast or upper abdomen that does not go away with Tums or heartburn medicine Nausea and/or vomiting Severe swelling in your hands, feet, and face   Tdap Vaccine It is recommended that you get the Tdap vaccine during the third trimester of EACH pregnancy to help protect your baby from getting pertussis (whooping cough) 27-36 weeks is the BEST time to do  this so that you can pass the protection on to your baby. During pregnancy is better than after pregnancy, but if you are unable to get it during pregnancy it will be offered at the hospital.  You can get this vaccine with [place], at the health department, your family doctor, or some local pharmacies Everyone who will be around your baby should also be up-to-date on their vaccines before the baby comes. Adults (who are not pregnant) only need 1 dose of Tdap during adulthood.   [facility] Pediatricians/Family Doctors [facility]): [address] Colette Ribas, [phone]           [facility]: [address], [phone]                [facility]): [address], [phone] (call to ask if accepting patients) [facility] Department: [address], [place], [phone]    [facility] Pediatricians/Family Doctors [facility]): [phone] [address], [phone] [facility]: [address] [place], [phone] [facility]: [address], [phone]  [facility] Doctors  [facility]): [phone] [facility]: [address], [phone]   [facility] Doctors [facility]: [address], [phone]  [facility] Family Doctors  [facility]: [phone], [phone]  Home Blood Pressure Monitoring for Patients   Your provider has recommended that you check your  blood pressure (BP) at least once a week at home. If you do not have a blood pressure cuff at home, one will be provided for you. Contact your provider if you have not received your monitor within 1 week.   Helpful Tips for Accurate Home Blood Pressure Checks  Don't smoke, exercise, or drink caffeine 30 minutes before checking your BP Use the restroom before checking your BP (a full bladder can raise your  pressure) Relax in a comfortable upright chair Feet on the ground Left arm resting comfortably on a flat surface at the level of your heart Legs uncrossed Back supported Sit quietly and don't talk Place the cuff on your bare arm Adjust snuggly, so that only two fingertips can fit between your skin and the top of the cuff Check 2 readings separated by at least one minute Keep a log of your BP readings For a visual, please reference this diagram: http://ccnc.care/bpdiagram  Provider Name: [facility]     Phone: [phone]  Zone 1: ALL CLEAR  Continue to monitor your symptoms:  BP reading is less than 140 (top number) or less than 90 (bottom number)  No right upper stomach pain No headaches or seeing spots No feeling nauseated or throwing up No swelling in face and hands  Zone 2: CAUTION Call your doctor's office for any of the following:  BP reading is greater than 140 (top number) or greater than 90 (bottom number)  Stomach pain under your ribs in the middle or right side Headaches or seeing spots Feeling nauseated or throwing up Swelling in face and hands  Zone 3: EMERGENCY  Seek immediate medical care if you have any of the following:  BP reading is greater than160 (top number) or greater than 110 (bottom number) Severe headaches not improving with Tylenol Serious difficulty catching your breath Any worsening symptoms from Zone 2   Third Trimester of Pregnancy The third trimester is from week 29 through week 42, months 7 through 9. The third trimester is a time when the fetus is growing rapidly. At the end of the ninth month, the fetus is about 20 inches in length and weighs 6-10 pounds.  BODY CHANGES Your body goes through many changes during pregnancy. The changes vary from woman to woman.  Your weight will continue to increase. You can expect to gain 25-35 pounds (11-16 kg) by the end of the pregnancy. You may begin to get stretch marks on your hips, abdomen,  and breasts. You may urinate more often because the fetus is moving lower into your pelvis and pressing on your bladder. You may develop or continue to have heartburn as a result of your pregnancy. You may develop constipation because certain hormones are causing the muscles that push waste through your intestines to slow down. You may develop hemorrhoids or swollen, bulging veins (varicose veins). You may have pelvic pain because of the weight gain and pregnancy hormones relaxing your joints between the bones in your pelvis. Backaches may result from overexertion of the muscles supporting your posture. You may have changes in your hair. These can include thickening of your hair, rapid growth, and changes in texture. Some women also have hair loss during or after pregnancy, or hair that feels dry or thin. Your hair will most likely return to normal after your baby is born. Your breasts will continue to grow and be tender. A yellow discharge may leak from your breasts called colostrum. Your belly button may stick out. You may  feel short of breath because of your expanding uterus. You may notice the fetus "dropping," or moving lower in your abdomen. You may have a bloody mucus discharge. This usually occurs a few days to a week before labor begins. Your cervix becomes thin and soft (effaced) near your due date. WHAT TO EXPECT AT YOUR PRENATAL EXAMS  You will have prenatal exams every 2 weeks until week 36. Then, you will have weekly prenatal exams. During a routine prenatal visit: You will be weighed to make sure you and the fetus are growing normally. Your blood pressure is taken. Your abdomen will be measured to track your baby's growth. The fetal heartbeat will be listened to. Any test results from the previous visit will be discussed. You may have a cervical check near your due date to see if you have effaced. At around 36 weeks, your caregiver will check your cervix. At the same time, your  caregiver will also perform a test on the secretions of the vaginal tissue. This test is to determine if a type of bacteria, Group B streptococcus, is present. Your caregiver will explain this further. Your caregiver may ask you: What your birth plan is. How you are feeling. If you are feeling the baby move. If you have had any abnormal symptoms, such as leaking fluid, bleeding, severe headaches, or abdominal cramping. If you have any questions. Other tests or screenings that may be performed during your third trimester include: Blood tests that check for low iron levels (anemia). Fetal testing to check the health, activity level, and growth of the fetus. Testing is done if you have certain medical conditions or if there are problems during the pregnancy. FALSE LABOR You may feel small, irregular contractions that eventually go away. These are called Braxton Hicks contractions, or false labor. Contractions may last for hours, days, or even weeks before true labor sets in. If contractions come at regular intervals, intensify, or become painful, it is best to be seen by your caregiver.  SIGNS OF LABOR  Menstrual-like cramps. Contractions that are 5 minutes apart or less. Contractions that start on the top of the uterus and spread down to the lower abdomen and back. A sense of increased pelvic pressure or back pain. A watery or bloody mucus discharge that comes from the vagina. If you have any of these signs before the 37th week of pregnancy, call your caregiver right away. You need to go to the hospital to get checked immediately. HOME CARE INSTRUCTIONS  Avoid all smoking, herbs, alcohol, and unprescribed drugs. These chemicals affect the formation and growth of the baby. Follow your caregiver's instructions regarding medicine use. There are medicines that are either safe or unsafe to take during pregnancy. Exercise only as directed by your caregiver. Experiencing uterine cramps is a good sign to  stop exercising. Continue to eat regular, healthy meals. Wear a good support bra for breast tenderness. Do not use hot tubs, steam rooms, or saunas. Wear your seat belt at all times when driving. Avoid raw meat, uncooked cheese, cat litter boxes, and soil used by cats. These carry germs that can cause birth defects in the baby. Take your prenatal vitamins. Try taking a stool softener (if your caregiver approves) if you develop constipation. Eat more high-fiber foods, such as fresh vegetables or fruit and whole grains. Drink plenty of fluids to keep your urine clear or pale yellow. Take warm sitz baths to soothe any pain or discomfort caused by hemorrhoids. Use hemorrhoid cream if  your caregiver approves. If you develop varicose veins, wear support hose. Elevate your feet for 15 minutes, 3-4 times a day. Limit salt in your diet. Avoid heavy lifting, wear low heal shoes, and practice good posture. Rest a lot with your legs elevated if you have leg cramps or low back pain. Visit your dentist if you have not gone during your pregnancy. Use a soft toothbrush to brush your teeth and be gentle when you floss. A sexual relationship may be continued unless your caregiver directs you otherwise. Do not travel far distances unless it is absolutely necessary and only with the approval of your caregiver. Take prenatal classes to understand, practice, and ask questions about the labor and delivery. Make a trial run to the hospital. Pack your hospital bag. Prepare the baby's nursery. Continue to go to all your prenatal visits as directed by your caregiver. SEEK MEDICAL CARE IF: You are unsure if you are in labor or if your water has broken. You have dizziness. You have mild pelvic cramps, pelvic pressure, or nagging pain in your abdominal area. You have persistent nausea, vomiting, or diarrhea. You have a bad smelling vaginal discharge. You have pain with urination. SEEK IMMEDIATE MEDICAL CARE IF:  You  have a fever. You are leaking fluid from your vagina. You have spotting or bleeding from your vagina. You have severe abdominal cramping or pain. You have rapid weight loss or gain. You have shortness of breath with chest pain. You notice sudden or extreme swelling of your face, hands, ankles, feet, or legs. You have not felt your baby move in over an hour. You have severe headaches that do not go away with medicine. You have vision changes. Document Released: 09/13/2001 Document Revised: 09/24/2013 Document Reviewed: 11/20/2012 [facility] Patient Information 2015 [place], [place]. This information is not intended to replace advice given to you by your health care provider. Make sure you discuss any questions you have with your health care provider.

## 2024-05-29 NOTE — Progress Notes (Signed)
 HIGH-RISK PREGNANCY VISIT Patient name: Yvonne Ayala MRN [id number]  Date of birth: 2001-02-20 Chief Complaint:   Routine Prenatal Visit  History of Present Illness:   Yvonne Ayala is a 23 y.o. G15P2002 female at [redacted]w[redacted]d with an Estimated Date of Delivery: 07/14/24 being seen today for ongoing management of a high-risk pregnancy complicated by obesity (BMI >=35 and <40).    Today she reports pressure making it difficult to walk. Contractions: Irritability. Vag. Bleeding: None.  Movement: Present. denies leaking of fluid.      01/01/2024   10:47 AM 08/14/2020   11:27 AM 10/16/2017    2:22 PM 09/07/2017   10:52 AM  Depression screen PHQ 2/9  Decreased Interest 0 0 0 0  Down, Depressed, Hopeless 0 0 0 0  PHQ - 2 Score 0 0 0 0  Altered sleeping 1 1 0 0  Tired, decreased energy 1 0 0 0  Change in appetite 1 1 0 0  Feeling bad or failure about yourself  0 0 0 0  Trouble concentrating 0 0 0 0  Moving slowly or fidgety/restless 0 0 0 0  Suicidal thoughts 0 0 0 0  PHQ-9 Score 3 2 0 0        01/01/2024   10:47 AM 08/14/2020   11:28 AM  GAD 7 : Generalized Anxiety Score  Nervous, Anxious, on Edge 0 0  Control/stop worrying 0 0  Worry too much - different things 1 0  Trouble relaxing 0 0  Restless 0 0  Easily annoyed or irritable 2 0  Afraid - awful might happen 0 0  Total GAD 7 Score 3 0     Review of Systems:   Pertinent items are noted in HPI Denies abnormal vaginal discharge w/ itching/odor/irritation, headaches, visual changes, shortness of breath, chest pain, abdominal pain, severe nausea/vomiting, or problems with urination or bowel movements unless otherwise stated above. Pertinent History Reviewed:  Reviewed past medical,surgical, social, obstetrical and family history.  Reviewed problem list, medications and allergies. Physical Assessment:   Vitals:   05/29/24 1046  BP: 104/71  Pulse: 66  Weight: 275 lb (124.7 kg)  Body mass index is 41.21 kg/m.            Physical Examination:   General appearance: alert, well appearing, and in no distress  Mental status: alert, oriented to person, place, and time  Skin: warm & dry   Extremities:      Cardiovascular: normal heart rate noted  Respiratory: normal respiratory effort, no distress  Abdomen: gravid, soft, non-tender  Pelvic: Cervical exam deferred ; Pap collected        Fetal Status: Fetal Heart Rate (bpm): 128 u/s   Movement: Present    Fetal Surveillance Testing today: US  33+3 wks,cephalic,posterior placenta gr 3,AFI 13 cm,FHR 128 bpm,EFW 2496 g 79%    Chaperone: Aleck Blase  No results found for this or any previous visit (from the past 24 hours).  Assessment & Plan:  High-risk pregnancy: G3P2002 at [redacted]w[redacted]d with an Estimated Date of Delivery: 07/14/24   1) PGBMI 40, stable, EFW 79% today  2) Pressure, has tried mat support belt without much relief; just uncomfortable  Meds: No orders of the defined types were placed in this encounter.   Labs/procedures today: pap, Tdap, and U/S  Treatment Plan: 2x/wk testing or weekly BPP with growth q 4wks  Reviewed: Preterm labor symptoms and general obstetric precautions including but not limited to vaginal bleeding, contractions, leaking of fluid  and fetal movement were reviewed in detail with the patient.  All questions were answered. Does have home bp cuff. Office bp cuff given: not applicable. Check bp weekly, let us  know if consistently >140 and/or >90.  Follow-up: Return for As scheduled.   Future Appointments  Date Time Provider Department Center  06/17/2024  9:10 AM [facility] NURSE [facility]  06/20/2024 10:30 AM [facility] NURSE [facility]  06/20/2024 10:50 AM Kizzie Suzen SAUNDERS, CNM [facility]  06/27/2024  9:15 AM [facility] US  [facility] None  06/27/2024 10:10 AM Kizzie Suzen SAUNDERS, CNM [facility]  07/01/2024  9:10 AM [facility] NURSE [facility]  07/04/2024  9:50 AM [facility] NURSE [facility]   07/04/2024 10:10 AM Kizzie Suzen SAUNDERS, CNM [facility]  07/08/2024  9:10 AM [facility] NURSE [facility]  07/11/2024  9:50 AM [facility] NURSE [facility]  07/11/2024 10:10 AM Kizzie Suzen SAUNDERS, CNM [facility]    Orders Placed This Encounter  Procedures   Tdap vaccine greater than or equal to 7yo IM   Suzen JONETTA Gentry [facility] 05/29/2024 11:03 AM

## 2024-05-29 NOTE — Progress Notes (Signed)
 US  33+3 wks,cephalic,posterior placenta gr 3,AFI 13 cm,FHR 128 bpm,EFW 2496 g 79%

## 2024-05-30 ENCOUNTER — Other Ambulatory Visit (HOSPITAL_COMMUNITY): Payer: Self-pay

## 2024-05-31 LAB — CYTOLOGY - PAP
Comment: NEGATIVE
Diagnosis: NEGATIVE
High risk HPV: NEGATIVE

## 2024-06-03 ENCOUNTER — Ambulatory Visit: Payer: Self-pay | Admitting: Advanced Practice Midwife

## 2024-06-04 ENCOUNTER — Encounter: Payer: Self-pay | Admitting: Women's Health

## 2024-06-06 DIAGNOSIS — Z3482 Encounter for supervision of other normal pregnancy, second trimester: Secondary | ICD-10-CM | POA: Diagnosis not present

## 2024-06-06 DIAGNOSIS — Z3483 Encounter for supervision of other normal pregnancy, third trimester: Secondary | ICD-10-CM | POA: Diagnosis not present

## 2024-06-14 DIAGNOSIS — Z419 Encounter for procedure for purposes other than remedying health state, unspecified: Secondary | ICD-10-CM | POA: Diagnosis not present

## 2024-06-17 ENCOUNTER — Ambulatory Visit: Admitting: *Deleted

## 2024-06-17 ENCOUNTER — Encounter: Payer: Self-pay | Admitting: Women's Health

## 2024-06-17 VITALS — BP 102/70 | HR 85

## 2024-06-17 DIAGNOSIS — O0993 Supervision of high risk pregnancy, unspecified, third trimester: Secondary | ICD-10-CM

## 2024-06-17 DIAGNOSIS — Z3A36 36 weeks gestation of pregnancy: Secondary | ICD-10-CM | POA: Diagnosis not present

## 2024-06-17 DIAGNOSIS — Z6841 Body Mass Index (BMI) 40.0 and over, adult: Secondary | ICD-10-CM

## 2024-06-17 DIAGNOSIS — O0990 Supervision of high risk pregnancy, unspecified, unspecified trimester: Secondary | ICD-10-CM

## 2024-06-17 NOTE — Progress Notes (Signed)
   NURSE VISIT- NST  SUBJECTIVE:  Yvonne Ayala is a 23 y.o. G75P2002 female at [redacted]w[redacted]d, here for a NST for pregnancy complicated by Morbid obesity (BMI >=40).  She reports active fetal movement, contractions: none, vaginal bleeding: none, membranes: intact.   OBJECTIVE:  LMP 10/08/2023   Appears well, no apparent distress  No results found for this or any previous visit (from the past 24 hours).  NST: FHR baseline 135 bpm, Variability: moderate, Accelerations:present, Decelerations:  Absent= Cat 1/reactive Toco: none   ASSESSMENT: G3P2002 at [redacted]w[redacted]d with Morbid obesity (BMI >=40) NST reactive  PLAN: EFM strip reviewed by Dr. Ozan   Recommendations: keep next appointment as scheduled    Alan LITTIE Fischer  06/17/2024 9:11 AM

## 2024-06-20 ENCOUNTER — Other Ambulatory Visit

## 2024-06-20 ENCOUNTER — Ambulatory Visit: Admitting: Women's Health

## 2024-06-20 ENCOUNTER — Encounter: Payer: Self-pay | Admitting: Women's Health

## 2024-06-20 ENCOUNTER — Other Ambulatory Visit (HOSPITAL_COMMUNITY)
Admission: RE | Admit: 2024-06-20 | Discharge: 2024-06-20 | Disposition: A | Discharge status: Home / Self Care | Source: Ambulatory Visit | Attending: Women's Health | Admitting: Women's Health

## 2024-06-20 VITALS — BP 103/72 | HR 99 | Wt 275.0 lb

## 2024-06-20 DIAGNOSIS — Z6841 Body Mass Index (BMI) 40.0 and over, adult: Secondary | ICD-10-CM

## 2024-06-20 DIAGNOSIS — Z3A36 36 weeks gestation of pregnancy: Secondary | ICD-10-CM

## 2024-06-20 DIAGNOSIS — Z3483 Encounter for supervision of other normal pregnancy, third trimester: Secondary | ICD-10-CM | POA: Insufficient documentation

## 2024-06-20 DIAGNOSIS — O0993 Supervision of high risk pregnancy, unspecified, third trimester: Secondary | ICD-10-CM

## 2024-06-20 DIAGNOSIS — O0990 Supervision of high risk pregnancy, unspecified, unspecified trimester: Secondary | ICD-10-CM

## 2024-06-20 NOTE — Patient Instructions (Signed)
Yvonne Ayala, thank you for choosing our office today! We appreciate the opportunity to meet your healthcare needs. You may receive a short survey by mail, e-mail, or through [employer]. If you are happy with your care we would appreciate if you could take just a few minutes to complete the survey questions. We read all of your comments and take your feedback very seriously. Thank you again for choosing our office.  Center for [employer] Team at [place] at [facility] ([place], [place] [address]) Entrance C, located off of [address] parking   CLASSES: Go to [email or web address] to register for classes (childbirth, breastfeeding, waterbirth, infant CPR, daddy bootcamp, etc.)  Call the office [phone]) or go to [facility] if: You begin to have strong, frequent contractions Your water breaks.  Sometimes it is a big gush of fluid, sometimes it is just a trickle that keeps getting your panties wet or running down your legs You have vaginal bleeding.  It is normal to have a small amount of spotting if your cervix was checked.  You don't feel your baby moving like normal.  If you don't, get you something to eat and drink and lay down and focus on feeling your baby move.   If your baby is still not moving like normal, you should call the office or go to [facility].  Call the office [phone]) or go to [facility] hospital for these signs of pre-eclampsia: Severe headache that does not go away with Tylenol Visual changes- seeing spots, double, blurred vision Pain under your right breast or upper abdomen that does not go away with Tums or heartburn medicine Nausea and/or vomiting Severe swelling in your hands, feet, and face   Tdap Vaccine It is recommended that you get the Tdap vaccine during the third trimester of EACH pregnancy to help protect your baby from getting pertussis (whooping cough) 27-36 weeks is the BEST time to do  this so that you can pass the protection on to your baby. During pregnancy is better than after pregnancy, but if you are unable to get it during pregnancy it will be offered at the hospital.  You can get this vaccine with [place], at the health department, your family doctor, or some local pharmacies Everyone who will be around your baby should also be up-to-date on their vaccines before the baby comes. Adults (who are not pregnant) only need 1 dose of Tdap during adulthood.   [facility] Pediatricians/Family Doctors [facility]): [address] [place], [facility]: [address] [place], [phone]                [facility]): [place], [phone] (call to ask if accepting patients) [facility] Department: [address], [place], [place] Pediatricians/Family Doctors [facility]): [place] [place], [address], [place] [facility]: [address] [place], [place] [facility]: [place], [place] [facility]): [phone] [facility]: [address], [place]: [address] [place] [facility]: [phone], [phone]  Home Blood Pressure Monitoring for Patients   Your provider has recommended that you check your  blood pressure (BP) at least once a week at home. If you do not have a blood pressure cuff at home, one will be provided for you. Contact your provider if you have not received your monitor within 1 week.   Helpful Tips for Accurate Home Blood Pressure Checks  Don't smoke, exercise, or drink caffeine 30 minutes before checking your BP Use the restroom before checking your BP (a full bladder can raise your  pressure) Relax in a comfortable upright chair Feet on the ground Left arm resting comfortably on a flat surface at the level of your heart Legs uncrossed Back supported Sit quietly and don't talk Place the cuff on your bare arm Adjust snuggly, so that only two fingertips can fit between your skin and the top of the cuff Check 2 readings separated by at least one minute Keep a log of your BP readings For a visual, please reference this diagram: http://ccnc.care/bpdiagram  Provider Name: [facility]     Phone: [phone]  Zone 1: ALL CLEAR  Continue to monitor your symptoms:  BP reading is less than 140 (top number) or less than 90 (bottom number)  No right upper stomach pain No headaches or seeing spots No feeling nauseated or throwing up No swelling in face and hands  Zone 2: CAUTION Call your doctor's office for any of the following:  BP reading is greater than 140 (top number) or greater than 90 (bottom number)  Stomach pain under your ribs in the middle or right side Headaches or seeing spots Feeling nauseated or throwing up Swelling in face and hands  Zone 3: EMERGENCY  Seek immediate medical care if you have any of the following:  BP reading is greater than160 (top number) or greater than 110 (bottom number) Severe headaches not improving with Tylenol Serious difficulty catching your breath Any worsening symptoms from Zone 2  Preterm Labor and Birth Information  The normal length of a pregnancy is 39-41 weeks. Preterm labor is when labor starts before 37 completed weeks of pregnancy. What are the risk factors for preterm labor? Preterm labor is more likely to occur in women who: Have certain infections during pregnancy such as a bladder infection, sexually transmitted infection, or infection inside the uterus (chorioamnionitis). Have a shorter-than-normal cervix. Have gone into preterm labor before. Have had surgery on their cervix. Are younger than age 17  or older than age 35. Are African American. Are pregnant with twins or multiple babies (multiple gestation). Take street drugs or smoke while pregnant. Do not gain enough weight while pregnant. Became pregnant shortly after having been pregnant. What are the symptoms of preterm labor? Symptoms of preterm labor include: Cramps similar to those that can happen during a menstrual period. The cramps may happen with diarrhea. Pain in the abdomen or lower back. Regular uterine contractions that may feel like tightening of the abdomen. A feeling of increased pressure in the pelvis. Increased watery or bloody mucus discharge from the vagina. Water breaking (ruptured amniotic sac). Why is it important to recognize signs of preterm labor? It is important to recognize signs of preterm labor because babies who are born prematurely may not be fully developed. This can put them at an increased risk for: Long-term (chronic) heart and lung problems. Difficulty immediately after birth with regulating body systems, including blood sugar, body temperature, heart rate, and breathing rate. Bleeding in the brain. Cerebral palsy. Learning difficulties. Death. These risks are highest for babies who are born before 34 weeks   of pregnancy. How is preterm labor treated? Treatment depends on the length of your pregnancy, your condition, and the health of your baby. It may involve: Having a stitch (suture) placed in your cervix to prevent your cervix from opening too early (cerclage). Taking or being given medicines, such as: Hormone medicines. These may be given early in pregnancy to help support the pregnancy. Medicine to stop contractions. Medicines to help mature the baby's lungs. These may be prescribed if the risk of delivery is high. Medicines to prevent your baby from developing cerebral palsy. If the labor happens before 34 weeks of pregnancy, you may need to stay in the hospital. What should I do if I  think I am in preterm labor? If you think that you are going into preterm labor, call your health care provider right away. How can I prevent preterm labor in future pregnancies? To increase your chance of having a full-term pregnancy: Do not use any tobacco products, such as cigarettes, chewing tobacco, and e-cigarettes. If you need help quitting, ask your health care provider. Do not use street drugs or medicines that have not been prescribed to you during your pregnancy. Talk with your health care provider before taking any herbal supplements, even if you have been taking them regularly. Make sure you gain a healthy amount of weight during your pregnancy. Watch for infection. If you think that you might have an infection, get it checked right away. Make sure to tell your health care provider if you have gone into preterm labor before. This information is not intended to replace advice given to you by your health care provider. Make sure you discuss any questions you have with your health care provider. Document Revised: 01/11/2019 Document Reviewed: 02/10/2016 Elsevier Patient Education  2020 Elsevier Inc.   

## 2024-06-20 NOTE — Progress Notes (Signed)
 HIGH-RISK PREGNANCY VISIT Patient name: Yvonne Ayala MRN [id number]  Date of birth: 03-18-2001 Chief Complaint:   Routine Prenatal Visit (culture)  History of Present Illness:   Yvonne Ayala is a 23 y.o. G71P2002 female at [redacted]w[redacted]d with an Estimated Date of Delivery: 07/14/24 being seen today for ongoing management of a high-risk pregnancy complicated by PG BMI 40.    Today she reports no complaints. Contractions: Irregular.  .  Movement: Present. denies leaking of fluid.      01/01/2024   10:47 AM 08/14/2020   11:27 AM 10/16/2017    2:22 PM 09/07/2017   10:52 AM  Depression screen PHQ 2/9  Decreased Interest 0 0 0 0  Down, Depressed, Hopeless 0 0 0 0  PHQ - 2 Score 0 0 0 0  Altered sleeping 1 1 0 0  Tired, decreased energy 1 0 0 0  Change in appetite 1 1 0 0  Feeling bad or failure about yourself  0 0 0 0  Trouble concentrating 0 0 0 0  Moving slowly or fidgety/restless 0 0 0 0  Suicidal thoughts 0 0 0 0  PHQ-9 Score 3 2 0 0        01/01/2024   10:47 AM 08/14/2020   11:28 AM  GAD 7 : Generalized Anxiety Score  Nervous, Anxious, on Edge 0 0  Control/stop worrying 0 0  Worry too much - different things 1 0  Trouble relaxing 0 0  Restless 0 0  Easily annoyed or irritable 2 0  Afraid - awful might happen 0 0  Total GAD 7 Score 3 0     Review of Systems:   Pertinent items are noted in HPI Denies abnormal vaginal discharge w/ itching/odor/irritation, headaches, visual changes, shortness of breath, chest pain, abdominal pain, severe nausea/vomiting, or problems with urination or bowel movements unless otherwise stated above. Pertinent History Reviewed:  Reviewed past medical,surgical, social, obstetrical and family history.  Reviewed problem list, medications and allergies. Physical Assessment:   Vitals:   06/20/24 1025  BP: 103/72  Pulse: 99  Weight: 275 lb (124.7 kg)  Body mass index is 41.21 kg/m.           Physical Examination:   General appearance:  alert, well appearing, and in no distress  Mental status: alert, oriented to person, place, and time  Skin: warm & dry   Extremities: Edema: None    Cardiovascular: normal heart rate noted  Respiratory: normal respiratory effort, no distress  Abdomen: gravid, soft, non-tender  Pelvic: Cervical exam performed  Dilation: Fingertip Effacement (%): Thick Station: Ballotable  Fetal Status:     Movement: Present Presentation: Vertex  Fetal Surveillance Testing today: NST: FHR baseline 125 bpm, Variability: moderate, Accelerations:present, Decelerations:  Absent= Cat 1/reactive Toco: none    Chaperone: Peggy Dones  No results found for this or any previous visit (from the past 24 hours).  Assessment & Plan:  High-risk pregnancy: G3P2002 at [redacted]w[redacted]d with an Estimated Date of Delivery: 07/14/24   1) PGBMI 40, currently 41  Meds: No orders of the defined types were placed in this encounter.  Labs/procedures today: GBS, GC/CT, SVE, and NST  Treatment Plan:  EFW next week   2x/wk nst    Deliver @ 39-40.6wks   Reviewed: Preterm labor symptoms and general obstetric precautions including but not limited to vaginal bleeding, contractions, leaking of fluid and fetal movement were reviewed in detail with the patient.  All questions were answered. Does have home  bp cuff. Office bp cuff given: not applicable. Check bp weekly, let us  know if consistently >140 and/or >90.  Follow-up: Return for As scheduled.   Future Appointments  Date Time Provider Department Center  06/27/2024  9:15 AM [facility] US  [facility] None  06/27/2024 10:10 AM Kizzie Suzen SAUNDERS, CNM [facility]  07/01/2024  9:10 AM [facility] NURSE [facility]  07/04/2024  9:50 AM [facility] NURSE [facility]  07/04/2024 10:10 AM Kizzie Suzen SAUNDERS, CNM [facility]  07/08/2024  9:10 AM [facility] NURSE [facility]  07/11/2024  9:50 AM [facility] NURSE [facility]  07/11/2024 10:10 AM Kizzie Suzen SAUNDERS, CNM [facility]     No orders of the defined types were placed in this encounter.  Suzen SAUNDERS Kizzie CNM, [facility] 06/20/2024 10:58 AM

## 2024-06-21 LAB — CERVICOVAGINAL ANCILLARY ONLY
Chlamydia: NEGATIVE
Comment: NEGATIVE
Comment: NORMAL
Neisseria Gonorrhea: NEGATIVE

## 2024-06-24 ENCOUNTER — Other Ambulatory Visit

## 2024-06-24 ENCOUNTER — Encounter: Payer: Self-pay | Admitting: Women's Health

## 2024-06-24 LAB — CULTURE, BETA STREP (GROUP B ONLY)

## 2024-06-26 ENCOUNTER — Encounter: Admitting: Women's Health

## 2024-06-26 ENCOUNTER — Other Ambulatory Visit

## 2024-06-27 ENCOUNTER — Other Ambulatory Visit

## 2024-06-27 ENCOUNTER — Encounter: Payer: Self-pay | Admitting: Women's Health

## 2024-06-27 ENCOUNTER — Ambulatory Visit: Admitting: Women's Health

## 2024-06-27 VITALS — BP 107/64 | HR 94 | Wt 273.0 lb

## 2024-06-27 DIAGNOSIS — Z3483 Encounter for supervision of other normal pregnancy, third trimester: Secondary | ICD-10-CM

## 2024-06-27 DIAGNOSIS — O0993 Supervision of high risk pregnancy, unspecified, third trimester: Secondary | ICD-10-CM

## 2024-06-27 DIAGNOSIS — O0992 Supervision of high risk pregnancy, unspecified, second trimester: Secondary | ICD-10-CM

## 2024-06-27 DIAGNOSIS — Z6841 Body Mass Index (BMI) 40.0 and over, adult: Secondary | ICD-10-CM | POA: Diagnosis not present

## 2024-06-27 DIAGNOSIS — Z3A37 37 weeks gestation of pregnancy: Secondary | ICD-10-CM | POA: Diagnosis not present

## 2024-06-27 DIAGNOSIS — O0990 Supervision of high risk pregnancy, unspecified, unspecified trimester: Secondary | ICD-10-CM

## 2024-06-27 NOTE — Progress Notes (Signed)
 US  37+4 wks,cephalic,posterior placenta gr 3,FHR 130 bpm,AFI 14 cm,EFW 3495 g 80%

## 2024-06-27 NOTE — Patient Instructions (Signed)
[place], thank you for choosing our office today! We appreciate the opportunity to meet your healthcare needs. You may receive a short survey by mail, e-mail, or through [employer]. If you are happy with your care we would appreciate if you could take just a few minutes to complete the survey questions. We read all of your comments and take your feedback very seriously. Thank you again for choosing our office.  Center for [employer] Team at [place] at [facility] ([place], [place] [address]) Entrance C, located off of [place] parking   CLASSES: Go to [email or web address] to register for classes (childbirth, breastfeeding, waterbirth, infant CPR, daddy bootcamp, etc.)  Call the office [phone]) or go to [facility] if: You begin to have strong, frequent contractions Your water breaks.  Sometimes it is a big gush of fluid, sometimes it is just a trickle that keeps getting your panties wet or running down your legs You have vaginal bleeding.  It is normal to have a small amount of spotting if your cervix was checked.  You don't feel your baby moving like normal.  If you don't, get you something to eat and drink and lay down and focus on feeling your baby move.   If your baby is still not moving like normal, you should call the office or go to [facility].  Call the office [phone]) or go to [facility] hospital for these signs of pre-eclampsia: Severe headache that does not go away with Tylenol Visual changes- seeing spots, double, blurred vision Pain under your right breast or upper abdomen that does not go away with Tums or heartburn medicine Nausea and/or vomiting Severe swelling in your hands, feet, and face   [facility] Pediatricians/Family Doctors [facility]): [address] [place], [place]: [address] [place], [place] [facility]): [place], [phone] (call to ask if accepting patients) [facility] Department: [address] [place], [place] [facility]): [address] [place], [address], [place] [facility]: [address] [place], [place] [facility]: [place], [place] [facility]): [phone] [facility]: [address], [place]: [address] [place] [facility]: [phone], [phone]  Home Blood Pressure Monitoring for Patients   Your provider has recommended that you check your blood pressure (BP) at least once a week at home. If you do not have a blood pressure cuff at home, one will be provided for you. Contact your provider if you have not received your monitor within 1 week.   Helpful Tips for Accurate Home Blood Pressure Checks  Don't smoke, exercise, or drink caffeine 30 minutes before checking your BP Use the restroom before checking your BP (a full bladder can raise your pressure) Relax in a comfortable upright chair Feet on the ground Left arm resting comfortably on a flat surface at the level of your heart Legs uncrossed Back supported Sit quietly and don't talk Place the cuff on your bare arm Adjust snuggly, so that only two fingertips  can fit between your skin and the top of the cuff Check 2 readings separated by at least one minute Keep a log of your BP readings For a visual, please reference this diagram: http://ccnc.care/bpdiagram  Provider Name: [facility]     Phone: [phone]  Zone 1: ALL CLEAR  Continue to monitor your symptoms:  BP reading is less than 140 (top number) or less than 90 (bottom number)  No right  upper stomach pain No headaches or seeing spots No feeling nauseated or throwing up No swelling in face and hands  Zone 2: CAUTION Call your doctor's office for any of the following:  BP reading is greater than 140 (top number) or greater than 90 (bottom number)  Stomach pain under your ribs in the middle or right side Headaches or seeing spots Feeling nauseated or throwing up Swelling in face and hands  Zone 3: EMERGENCY  Seek immediate medical care if you have any of the following:  BP reading is greater than160 (top number) or greater than 110 (bottom number) Severe headaches not improving with Tylenol Serious difficulty catching your breath Any worsening symptoms from Zone 2   Braxton Hicks Contractions Contractions of the uterus can occur throughout pregnancy, but they are not always a sign that you are in labor. You may have practice contractions called Braxton Hicks contractions. These false labor contractions are sometimes confused with true labor. What are [place] [address] contractions? Braxton Hicks contractions are tightening movements that occur in the muscles of the uterus before labor. Unlike true labor contractions, these contractions do not result in opening (dilation) and thinning of the cervix. Toward the end of pregnancy (32-34 weeks), Braxton Hicks contractions can happen more often and may become stronger. These contractions are sometimes difficult to tell apart from true labor because they can be very uncomfortable. You should not feel embarrassed if you go to the hospital with false labor. Sometimes, the only way to tell if you are in true labor is for your health care provider to look for changes in the cervix. The health care provider will do a physical exam and may monitor your contractions. If you are not in true labor, the exam should show that your cervix is not dilating and your water has not broken. If there are no other health problems associated with your  pregnancy, it is completely safe for you to be sent home with false labor. You may continue to have Braxton Hicks contractions until you go into true labor. How to tell the difference between true labor and false labor True labor Contractions last 30-70 seconds. Contractions become very regular. Discomfort is usually felt in the top of the uterus, and it spreads to the lower abdomen and low back. Contractions do not go away with walking. Contractions usually become more intense and increase in frequency. The cervix dilates and gets thinner. False labor Contractions are usually shorter and not as strong as true labor contractions. Contractions are usually irregular. Contractions are often felt in the front of the lower abdomen and in the groin. Contractions may go away when you walk around or change positions while lying down. Contractions get weaker and are shorter-lasting as time goes on. The cervix usually does not dilate or become thin. Follow these instructions at home:  Take over-the-counter and prescription medicines only as told by your health care provider. Keep up with your usual exercises and follow other instructions from your health care provider. Eat and drink lightly if you think  you are going into labor. If Braxton Hicks contractions are making you uncomfortable: Change your position from lying down or resting to walking, or change from walking to resting. Sit and rest in a tub of warm water. Drink enough fluid to keep your urine pale yellow. Dehydration may cause these contractions. Do slow and deep breathing several times an hour. Keep all follow-up prenatal visits as told by your health care provider. This is important. Contact a health care provider if: You have a fever. You have continuous pain in your abdomen. Get help right away if: Your contractions become stronger, more regular, and closer together. You have fluid leaking or gushing from your vagina. You pass  blood-tinged mucus (bloody show). You have bleeding from your vagina. You have low back pain that you never had before. You feel your baby's head pushing down and causing pelvic pressure. Your baby is not moving inside you as much as it used to. Summary Contractions that occur before labor are called Braxton Hicks contractions, false labor, or practice contractions. Braxton Hicks contractions are usually shorter, weaker, farther apart, and less regular than true labor contractions. True labor contractions usually become progressively stronger and regular, and they become more frequent. Manage discomfort from [facility] contractions by changing position, resting in a warm bath, drinking plenty of water, or practicing deep breathing. This information is not intended to replace advice given to you by your health care provider. Make sure you discuss any questions you have with your health care provider. Document Revised: 09/01/2017 Document Reviewed: 02/02/2017 Elsevier Patient Education  [place].

## 2024-06-27 NOTE — Progress Notes (Signed)
 HIGH-RISK PREGNANCY VISIT Patient name: Yvonne Ayala MRN [id number]  Date of birth: [birth year] Chief Complaint:   Routine Prenatal Visit  History of Present Illness:   Yvonne Ayala is a 23 y.o. G61P2002 female at [redacted]w[redacted]d with an Estimated Date of Delivery: 07/14/24 being seen today for ongoing management of a high-risk pregnancy complicated by PG BMI 40.    Today she reports occasional contractions. Contractions: Irregular. Vag. Bleeding: None.  Movement: Present. denies leaking of fluid.      06/27/2024    9:47 AM 01/01/2024   10:47 AM 08/14/2020   11:27 AM 10/16/2017    2:22 PM 09/07/2017   10:52 AM  Depression screen PHQ 2/9  Decreased Interest 1 0 0 0 0  Down, Depressed, Hopeless 0 0 0 0 0  PHQ - 2 Score 1 0 0 0 0  Altered sleeping 1 1 1  0 0  Tired, decreased energy 1 1 0 0 0  Change in appetite 0 1 1 0 0  Feeling bad or failure about yourself  0 0 0 0 0  Trouble concentrating 0 0 0 0 0  Moving slowly or fidgety/restless 0 0 0 0 0  Suicidal thoughts 0 0 0 0 0  PHQ-9 Score 3 3 2  0 0        06/27/2024    9:47 AM 01/01/2024   10:47 AM 08/14/2020   11:28 AM  GAD 7 : Generalized Anxiety Score  Nervous, Anxious, on Edge 0 0 0  Control/stop worrying 0 0 0  Worry too much - different things 0 1 0  Trouble relaxing 0 0 0  Restless 0 0 0  Easily annoyed or irritable 0 2 0  Afraid - awful might happen 0 0 0  Total GAD 7 Score 0 3 0     Review of Systems:   Pertinent items are noted in HPI Denies abnormal vaginal discharge w/ itching/odor/irritation, headaches, visual changes, shortness of breath, chest pain, abdominal pain, severe nausea/vomiting, or problems with urination or bowel movements unless otherwise stated above. Pertinent History Reviewed:  Reviewed past medical,surgical, social, obstetrical and family history.  Reviewed problem list, medications and allergies. Physical Assessment:   Vitals:   06/27/24 1013  BP: 107/64  Pulse: 94  Weight: 273 lb (123.8  kg)  Body mass index is 40.91 kg/m.           Physical Examination:   General appearance: alert, well appearing, and in no distress  Mental status: alert, oriented to person, place, and time  Skin: warm & dry   Extremities: Edema: None    Cardiovascular: normal heart rate noted  Respiratory: normal respiratory effort, no distress  Abdomen: gravid, soft, non-tender  Pelvic: Cervical exam performed  Dilation: 1 Effacement (%): Thick Station: Ballotable  Fetal Status:     Movement: Present Presentation: Vertex  Fetal Surveillance Testing today: US  37+4 wks,cephalic,posterior placenta gr 3,FHR 130 bpm,AFI 14 cm,EFW 3495 g 80%   Chaperone: Latisha Cresenzo  No results found for this or any previous visit (from the past 24 hours).  Assessment & Plan:  High-risk pregnancy: G3P2002 at [redacted]w[redacted]d with an Estimated Date of Delivery: 07/14/24   1) PGBMI 40, currently 40, EFW today 80%, wants membrane sweep @ 40w  Meds: No orders of the defined types were placed in this encounter.   Labs/procedures today: SVE and U/S  Treatment Plan:  2x/wk NST, IOL 39-40.6 (prefers 40.6)  Reviewed: Term labor symptoms and general obstetric precautions including but  not limited to vaginal bleeding, contractions, leaking of fluid and fetal movement were reviewed in detail with the patient.  All questions were answered. Does have home bp cuff. Office bp cuff given: not applicable. Check bp daily, let us  know if consistently >140 and/or >90.  Follow-up: Return for As scheduled.   Future Appointments  Date Time Provider Department Center  07/01/2024  9:10 AM [facility] NURSE [facility]  07/04/2024  9:50 AM [facility] NURSE [facility]  07/04/2024 10:10 AM Kizzie Suzen SAUNDERS, CNM [facility]  07/08/2024  9:10 AM [facility] NURSE [facility]  07/11/2024  9:50 AM [facility] NURSE [facility]  07/11/2024 10:10 AM Kizzie Suzen SAUNDERS, CNM [facility]    No orders of the defined types were placed  in this encounter.  Suzen SAUNDERS Kizzie CNM, [facility] 06/27/2024 10:35 AM

## 2024-07-01 ENCOUNTER — Ambulatory Visit: Admitting: *Deleted

## 2024-07-01 VITALS — BP 100/68 | HR 79 | Wt 275.0 lb

## 2024-07-01 DIAGNOSIS — Z3A38 38 weeks gestation of pregnancy: Secondary | ICD-10-CM

## 2024-07-01 DIAGNOSIS — O99213 Obesity complicating pregnancy, third trimester: Secondary | ICD-10-CM

## 2024-07-01 DIAGNOSIS — Z6841 Body Mass Index (BMI) 40.0 and over, adult: Secondary | ICD-10-CM

## 2024-07-01 NOTE — Progress Notes (Signed)
   NURSE VISIT- NST  SUBJECTIVE:  Yvonne Ayala is a 23 y.o. G39P2002 female at [redacted]w[redacted]d, here for a NST for pregnancy complicated by Morbid obesity (BMI >=40).  She reports active fetal movement, contractions: none, vaginal bleeding: none, membranes: intact.   OBJECTIVE:  BP 100/68   Pulse 79   Wt 275 lb (124.7 kg)   LMP 10/08/2023   BMI 41.21 kg/m   Appears well, no apparent distress  No results found for this or any previous visit (from the past 24 hours).  NST: FHR baseline 120 bpm, Variability: moderate, Accelerations:present, Decelerations:  Absent= Cat 1/reactive Toco: none   ASSESSMENT: G3P2002 at [redacted]w[redacted]d with Morbid obesity (BMI >=40) NST reactive  PLAN: EFM strip reviewed by Dr. Jayne   Recommendations: keep next appointment as scheduled    Rutherford Rover  07/01/2024 10:23 AM

## 2024-07-02 ENCOUNTER — Other Ambulatory Visit (HOSPITAL_COMMUNITY): Payer: Self-pay

## 2024-07-04 ENCOUNTER — Encounter: Payer: Self-pay | Admitting: Women's Health

## 2024-07-04 ENCOUNTER — Ambulatory Visit: Admitting: Women's Health

## 2024-07-04 ENCOUNTER — Other Ambulatory Visit

## 2024-07-04 VITALS — BP 99/68 | HR 88 | Wt 275.0 lb

## 2024-07-04 DIAGNOSIS — Z3A38 38 weeks gestation of pregnancy: Secondary | ICD-10-CM

## 2024-07-04 DIAGNOSIS — O0993 Supervision of high risk pregnancy, unspecified, third trimester: Secondary | ICD-10-CM

## 2024-07-04 DIAGNOSIS — Z3483 Encounter for supervision of other normal pregnancy, third trimester: Secondary | ICD-10-CM | POA: Diagnosis not present

## 2024-07-04 DIAGNOSIS — Z6841 Body Mass Index (BMI) 40.0 and over, adult: Secondary | ICD-10-CM

## 2024-07-04 NOTE — Progress Notes (Signed)
 HIGH-RISK PREGNANCY VISIT Patient name: Yvonne Ayala MRN [id number]  Date of birth: [birth year] Chief Complaint:   Routine Prenatal Visit  History of Present Illness:   Yvonne Ayala is a 23 y.o. G73P2002 female at [redacted]w[redacted]d with an Estimated Date of Delivery: 07/14/24 being seen today for ongoing management of a high-risk pregnancy complicated by PG BMI 40.    Today she reports no complaints. Contractions: Not present. Vag. Bleeding: None.  Movement: Present. denies leaking of fluid.      06/27/2024    9:47 AM 01/01/2024   10:47 AM 08/14/2020   11:27 AM 10/16/2017    2:22 PM 09/07/2017   10:52 AM  Depression screen PHQ 2/9  Decreased Interest 1 0 0 0 0  Down, Depressed, Hopeless 0 0 0 0 0  PHQ - 2 Score 1 0 0 0 0  Altered sleeping 1 1 1  0 0  Tired, decreased energy 1 1 0 0 0  Change in appetite 0 1 1 0 0  Feeling bad or failure about yourself  0 0 0 0 0  Trouble concentrating 0 0 0 0 0  Moving slowly or fidgety/restless 0 0 0 0 0  Suicidal thoughts 0 0 0 0 0  PHQ-9 Score 3 3 2  0 0        06/27/2024    9:47 AM 01/01/2024   10:47 AM 08/14/2020   11:28 AM  GAD 7 : Generalized Anxiety Score  Nervous, Anxious, on Edge 0 0 0  Control/stop worrying 0 0 0  Worry too much - different things 0 1 0  Trouble relaxing 0 0 0  Restless 0 0 0  Easily annoyed or irritable 0 2 0  Afraid - awful might happen 0 0 0  Total GAD 7 Score 0 3 0     Review of Systems:   Pertinent items are noted in HPI Denies abnormal vaginal discharge w/ itching/odor/irritation, headaches, visual changes, shortness of breath, chest pain, abdominal pain, severe nausea/vomiting, or problems with urination or bowel movements unless otherwise stated above. Pertinent History Reviewed:  Reviewed past medical,surgical, social, obstetrical and family history.  Reviewed problem list, medications and allergies. Physical Assessment:   Vitals:   07/04/24 1004  BP: 99/68  Pulse: 88  Weight: 275 lb (124.7 kg)   Body mass index is 41.21 kg/m.           Physical Examination:   General appearance: alert, well appearing, and in no distress  Mental status: alert, oriented to person, place, and time  Skin: warm & dry   Extremities:      Cardiovascular: normal heart rate noted  Respiratory: normal respiratory effort, no distress  Abdomen: gravid, soft, non-tender  Pelvic: Cervical exam performed  Dilation: 1 Effacement (%): Thick Station: Ballotable  Fetal Status:     Movement: Present Presentation: Vertex  Fetal Surveillance Testing today: NST: FHR baseline 115 bpm, Variability: moderate, Accelerations:present, Decelerations:  Absent= Cat 1/reactive Toco: none    Chaperone: Aleck Blase  No results found for this or any previous visit (from the past 24 hours).  Assessment & Plan:  High-risk pregnancy: G3P2002 at [redacted]w[redacted]d with an Estimated Date of Delivery: 07/14/24   1) PGBMI 40, currently 41  Meds: No orders of the defined types were placed in this encounter.  Labs/procedures today: NST  Treatment Plan:  2x/wk NST, IOL 39-40.6  Reviewed: Term labor symptoms and general obstetric precautions including but not limited to vaginal bleeding, contractions, leaking of fluid  and fetal movement were reviewed in detail with the patient.  All questions were answered. Does have home bp cuff. Office bp cuff given: not applicable. Check bp weekly, let us  know if consistently >140 and/or >90.  Follow-up: Return for As scheduled.   Future Appointments  Date Time Provider Department Center  07/08/2024  9:10 AM [facility] NURSE [facility]  07/11/2024  9:50 AM [facility] NURSE [facility]  07/11/2024 10:10 AM Kizzie Suzen SAUNDERS, CNM [facility]    No orders of the defined types were placed in this encounter.  Suzen SAUNDERS Kizzie CNM, [facility] 07/04/2024 10:58 AM

## 2024-07-04 NOTE — Patient Instructions (Signed)
Yvonne Ayala, thank you for choosing our office today! We appreciate the opportunity to meet your healthcare needs. You may receive a short survey by mail, e-mail, or through [employer]. If you are happy with your care we would appreciate if you could take just a few minutes to complete the survey questions. We read all of your comments and take your feedback very seriously. Thank you again for choosing our office.  Center for [employer] Team at [place] at [facility] ([place], [place] [address]) Entrance C, located off of [place] parking   CLASSES: Go to [email or web address] to register for classes (childbirth, breastfeeding, waterbirth, infant CPR, daddy bootcamp, etc.)  Call the office [phone]) or go to [facility] if: You begin to have strong, frequent contractions Your water breaks.  Sometimes it is a big gush of fluid, sometimes it is just a trickle that keeps getting your panties wet or running down your legs You have vaginal bleeding.  It is normal to have a small amount of spotting if your cervix was checked.  You don't feel your baby moving like normal.  If you don't, get you something to eat and drink and lay down and focus on feeling your baby move.   If your baby is still not moving like normal, you should call the office or go to [facility].  Call the office [phone]) or go to [facility] for these signs of pre-eclampsia: Severe headache that does not go away with Tylenol Visual changes- seeing spots, double, blurred vision Pain under your right breast or upper abdomen that does not go away with Tums or heartburn medicine Nausea and/or vomiting Severe swelling in your hands, feet, and face   [facility] Pediatricians/Family Doctors [facility]): [address] [place], [place]: [address] [place], [facility]): [place], [phone] (call to ask if accepting patients) [facility] Department: [address] [place], [facility]): [address], [place] [facility]: [address] [place], [facility] [place]: [place], [place] [facility]): [phone] [facility]: [address], [place]: [address] [place] [facility]: [phone], [phone]  Home Blood Pressure Monitoring for Patients   Your provider has recommended that you check your blood pressure (BP) at least once a week at home. If you do not have a blood pressure cuff at home, one will be provided for you. Contact your provider if you have not received your monitor within 1 week.   Helpful Tips for Accurate Home Blood Pressure Checks  Don't smoke, exercise, or drink caffeine 30 minutes before checking your BP Use the restroom before checking your BP (a full bladder can raise your pressure) Relax in a comfortable upright chair Feet on the ground Left arm resting comfortably on a flat surface at the level of your heart Legs uncrossed Back supported Sit quietly and don't talk Place the cuff on your bare arm Adjust snuggly, so that only two fingertips  can fit between your skin and the top of the cuff Check 2 readings separated by at least one minute Keep a log of your BP readings For a visual, please reference this diagram: http://ccnc.care/bpdiagram  Provider Name: [facility]     Phone: [phone]  Zone 1: ALL CLEAR  Continue to monitor your symptoms:  BP reading is less than 140 (top number) or less than 90 (bottom number)  No right  upper stomach pain No headaches or seeing spots No feeling nauseated or throwing up No swelling in face and hands  Zone 2: CAUTION Call your doctor's office for any of the following:  BP reading is greater than 140 (top number) or greater than 90 (bottom number)  Stomach pain under your ribs in the middle or right side Headaches or seeing spots Feeling nauseated or throwing up Swelling in face and hands  Zone 3: EMERGENCY  Seek immediate medical care if you have any of the following:  BP reading is greater than160 (top number) or greater than 110 (bottom number) Severe headaches not improving with Tylenol Serious difficulty catching your breath Any worsening symptoms from Zone 2   Braxton Hicks Contractions Contractions of the uterus can occur throughout pregnancy, but they are not always a sign that you are in labor. You may have practice contractions called Braxton Hicks contractions. These false labor contractions are sometimes confused with true labor. What are Yvonne Ayala contractions? Braxton Hicks contractions are tightening movements that occur in the muscles of the uterus before labor. Unlike true labor contractions, these contractions do not result in opening (dilation) and thinning of the cervix. Toward the end of pregnancy (32-34 weeks), Braxton Hicks contractions can happen more often and may become stronger. These contractions are sometimes difficult to tell apart from true labor because they can be very uncomfortable. You should not feel embarrassed if you go to the [facility] with false labor. Sometimes, the only way to tell if you are in true labor is for your health care provider to look for changes in the cervix. The health care provider will do a physical exam and may monitor your contractions. If you are not in true labor, the exam should show that your cervix is not dilating and your water has not broken. If there are no other health problems associated with your  pregnancy, it is completely safe for you to be sent home with false labor. You may continue to have Braxton Hicks contractions until you go into true labor. How to tell the difference between true labor and false labor True labor Contractions last 30-70 seconds. Contractions become very regular. Discomfort is usually felt in the top of the uterus, and it spreads to the lower abdomen and low back. Contractions do not go away with walking. Contractions usually become more intense and increase in frequency. The cervix dilates and gets thinner. False labor Contractions are usually shorter and not as strong as true labor contractions. Contractions are usually irregular. Contractions are often felt in the front of the lower abdomen and in the groin. Contractions may go away when you walk around or change positions while lying down. Contractions get weaker and are shorter-lasting as time goes on. The cervix usually does not dilate or become thin. Follow these instructions at home:  Take over-the-counter and prescription medicines only as told by your health care provider. Keep up with your usual exercises and follow other instructions from your health care provider. Eat and drink lightly if you think  you are going into labor. If Braxton Hicks contractions are making you uncomfortable: Change your position from lying down or resting to walking, or change from walking to resting. Sit and rest in a tub of warm water. Drink enough fluid to keep your urine pale yellow. Dehydration may cause these contractions. Do slow and deep breathing several times an hour. Keep all follow-up prenatal visits as told by your health care provider. This is important. Contact a health care provider if: You have a fever. You have continuous pain in your abdomen. Get help right away if: Your contractions become stronger, more regular, and closer together. You have fluid leaking or gushing from your vagina. You pass  blood-tinged mucus (bloody show). You have bleeding from your vagina. You have low back pain that you never had before. You feel your baby's head pushing down and causing pelvic pressure. Your baby is not moving inside you as much as it used to. Summary Contractions that occur before labor are called Braxton Hicks contractions, false labor, or practice contractions. Braxton Hicks contractions are usually shorter, weaker, farther apart, and less regular than true labor contractions. True labor contractions usually become progressively stronger and regular, and they become more frequent. Manage discomfort from Yvonne Ayala contractions by changing position, resting in a warm bath, drinking plenty of water, or practicing deep breathing. This information is not intended to replace advice given to you by your health care provider. Make sure you discuss any questions you have with your health care provider. Document Revised: 09/01/2017 Document Reviewed: 02/02/2017 Elsevier Patient Education  [place].

## 2024-07-08 ENCOUNTER — Ambulatory Visit: Admitting: *Deleted

## 2024-07-08 VITALS — BP 118/81 | HR 97 | Wt 278.0 lb

## 2024-07-08 DIAGNOSIS — E669 Obesity, unspecified: Secondary | ICD-10-CM | POA: Diagnosis not present

## 2024-07-08 DIAGNOSIS — Z3A39 39 weeks gestation of pregnancy: Secondary | ICD-10-CM

## 2024-07-08 DIAGNOSIS — O99213 Obesity complicating pregnancy, third trimester: Secondary | ICD-10-CM

## 2024-07-08 DIAGNOSIS — O0990 Supervision of high risk pregnancy, unspecified, unspecified trimester: Secondary | ICD-10-CM

## 2024-07-08 DIAGNOSIS — Z6841 Body Mass Index (BMI) 40.0 and over, adult: Secondary | ICD-10-CM

## 2024-07-08 NOTE — Progress Notes (Signed)
   NURSE VISIT- NST  SUBJECTIVE:  Yvonne Ayala is a 23 y.o. G40P2002 female at [redacted]w[redacted]d, here for a NST for pregnancy complicated by Morbid obesity (BMI >=40).  She reports active fetal movement, contractions: none, vaginal bleeding: none, membranes: intact.   OBJECTIVE:  BP 118/81   Pulse 97   Wt 278 lb (126.1 kg)   LMP 10/08/2023   BMI 41.65 kg/m   Appears well, no apparent distress  No results found for this or any previous visit (from the past 24 hours).  NST: FHR baseline 125 bpm, Variability: moderate, Accelerations:present, Decelerations:  Absent= Cat 1/reactive Toco: 4 noted  ASSESSMENT: G3P2002 at [redacted]w[redacted]d with Morbid obesity (BMI >=40) NST reactive  PLAN: EFM strip reviewed by Dr. Ozan   Recommendations: keep next appointment as scheduled    Yvonne Ayala  07/08/2024 9:50 AM

## 2024-07-11 ENCOUNTER — Encounter: Payer: Self-pay | Admitting: Women's Health

## 2024-07-11 ENCOUNTER — Encounter (HOSPITAL_COMMUNITY): Payer: Self-pay | Admitting: *Deleted

## 2024-07-11 ENCOUNTER — Telehealth (HOSPITAL_COMMUNITY): Payer: Self-pay | Admitting: *Deleted

## 2024-07-11 ENCOUNTER — Ambulatory Visit (INDEPENDENT_AMBULATORY_CARE_PROVIDER_SITE_OTHER): Admitting: Women's Health

## 2024-07-11 ENCOUNTER — Other Ambulatory Visit

## 2024-07-11 VITALS — BP 98/67 | HR 79 | Wt 276.6 lb

## 2024-07-11 DIAGNOSIS — Z6841 Body Mass Index (BMI) 40.0 and over, adult: Secondary | ICD-10-CM | POA: Diagnosis not present

## 2024-07-11 DIAGNOSIS — Z1389 Encounter for screening for other disorder: Secondary | ICD-10-CM | POA: Diagnosis not present

## 2024-07-11 DIAGNOSIS — Z331 Pregnant state, incidental: Secondary | ICD-10-CM

## 2024-07-11 DIAGNOSIS — Z3A39 39 weeks gestation of pregnancy: Secondary | ICD-10-CM | POA: Diagnosis not present

## 2024-07-11 DIAGNOSIS — O0993 Supervision of high risk pregnancy, unspecified, third trimester: Secondary | ICD-10-CM

## 2024-07-11 DIAGNOSIS — O0990 Supervision of high risk pregnancy, unspecified, unspecified trimester: Secondary | ICD-10-CM

## 2024-07-11 LAB — POCT URINALYSIS DIPSTICK OB
Blood, UA: NEGATIVE
Glucose, UA: NEGATIVE
Nitrite, UA: NEGATIVE
POC,PROTEIN,UA: NEGATIVE

## 2024-07-11 NOTE — Progress Notes (Signed)
 HIGH-RISK PREGNANCY VISIT Patient name: Yvonne Ayala MRN [id number]  Date of birth: 27-Apr-2001 Chief Complaint:   Routine Prenatal Visit and Non-stress Test (Member sweep)  History of Present Illness:   Yvonne Ayala is a 23 y.o. G44P2002 female at [redacted]w[redacted]d with an Estimated Date of Delivery: 07/14/24 being seen today for ongoing management of a high-risk pregnancy complicated by PG BMI 40.    Today she reports no complaints. Wants membrane sweep and IOL either tomorrow or Sat. Contractions: Irregular.  .  Movement: Present. denies leaking of fluid.      06/27/2024    9:47 AM 01/01/2024   10:47 AM 08/14/2020   11:27 AM 10/16/2017    2:22 PM 09/07/2017   10:52 AM  Depression screen PHQ 2/9  Decreased Interest 1 0 0 0 0  Down, Depressed, Hopeless 0 0 0 0 0  PHQ - 2 Score 1 0 0 0 0  Altered sleeping 1 1 1  0 0  Tired, decreased energy 1 1 0 0 0  Change in appetite 0 1 1 0 0  Feeling bad or failure about yourself  0 0 0 0 0  Trouble concentrating 0 0 0 0 0  Moving slowly or fidgety/restless 0 0 0 0 0  Suicidal thoughts 0 0 0 0 0  PHQ-9 Score 3 3 2  0 0        06/27/2024    9:47 AM 01/01/2024   10:47 AM 08/14/2020   11:28 AM  GAD 7 : Generalized Anxiety Score  Nervous, Anxious, on Edge 0 0 0  Control/stop worrying 0 0 0  Worry too much - different things 0 1 0  Trouble relaxing 0 0 0  Restless 0 0 0  Easily annoyed or irritable 0 2 0  Afraid - awful might happen 0 0 0  Total GAD 7 Score 0 3 0     Review of Systems:   Pertinent items are noted in HPI Denies abnormal vaginal discharge w/ itching/odor/irritation, headaches, visual changes, shortness of breath, chest pain, abdominal pain, severe nausea/vomiting, or problems with urination or bowel movements unless otherwise stated above. Pertinent History Reviewed:  Reviewed past medical,surgical, social, obstetrical and family history.  Reviewed problem list, medications and allergies. Physical Assessment:   Vitals:    07/11/24 0953  BP: 98/67  Pulse: 79  Weight: 276 lb 9.6 oz (125.5 kg)  Body mass index is 41.45 kg/m.           Physical Examination:   General appearance: alert, well appearing, and in no distress  Mental status: alert, oriented to person, place, and time  Skin: warm & dry   Extremities: Edema: None    Cardiovascular: normal heart rate noted  Respiratory: normal respiratory effort, no distress  Abdomen: gravid, soft, non-tender  Pelvic: Cervical exam performed 1.5/th/ballotable, Offered membrane sweeping, discussed r/b- pt decided to proceed, so membranes swept.  Dilation: 3 Effacement (%): Thick Station: Ballotable  Fetal Status:     Movement: Present Presentation: Vertex  Fetal Surveillance Testing today: NST: FHR baseline 120 bpm, Variability: moderate, Accelerations:present, Decelerations:  Absent= Cat 1/reactive Toco: toco was not on    Chaperone: [employer]  Results for orders placed or performed in visit on 07/11/24 (from the past 24 hours)  POC Urinalysis Dipstick OB   Collection Time: 07/11/24 10:13 AM  Result Value Ref Range   Color, UA     Clarity, UA     Glucose, UA Negative Negative   Bilirubin,  UA     Ketones, UA 1+    Spec Grav, UA     Blood, UA negative    pH, UA     POC,PROTEIN,UA Negative Negative, Trace, Small (1+), Moderate (2+), Large (3+), 4+   Urobilinogen, UA     Nitrite, UA negative    Leukocytes, UA Moderate (2+) (A) Negative   Appearance     Odor      Assessment & Plan:  High-risk pregnancy: G3P2002 at [redacted]w[redacted]d with an Estimated Date of Delivery: 07/14/24   1) PGBMI 40, currently 41, membranes swept today, IOL scheduled for 10/11 AM per request.  IOL form faxed and orders placed   Meds: No orders of the defined types were placed in this encounter.  Labs/procedures today: SVE, membrane sweep, and NST  Treatment Plan:  IOL 10/11 AM  Reviewed: Term labor symptoms and general obstetric precautions including but not limited to vaginal  bleeding, contractions, leaking of fluid and fetal movement were reviewed in detail with the patient.  All questions were answered. Does have home bp cuff. Office bp cuff given: not applicable. Check bp daily, let us  know if consistently >140 and/or >90.  Follow-up: No follow-ups on file.   Future Appointments  Date Time Provider Department Center  07/13/2024  7:15 AM [facility] SCHED ROOM [facility] None    Orders Placed This Encounter  Procedures   POC Urinalysis Dipstick OB   Suzen JONELLE Fetters CNM, [facility] 07/11/2024 10:35 AM

## 2024-07-11 NOTE — Patient Instructions (Signed)
[place], thank you for choosing our office today! We appreciate the opportunity to meet your healthcare needs. You may receive a short survey by mail, e-mail, or through [employer]. If you are happy with your care we would appreciate if you could take just a few minutes to complete the survey questions. We read all of your comments and take your feedback very seriously. Thank you again for choosing our office.  Center for [employer] Team at [place] at [facility] ([place], [place] [address]) Entrance C, located off of [place] parking   CLASSES: Go to [email or web address] to register for classes (childbirth, breastfeeding, waterbirth, infant CPR, daddy bootcamp, etc.)  Call the office [phone]) or go to [facility] if: You begin to have strong, frequent contractions Your water breaks.  Sometimes it is a big gush of fluid, sometimes it is just a trickle that keeps getting your panties wet or running down your legs You have vaginal bleeding.  It is normal to have a small amount of spotting if your cervix was checked.  You don't feel your baby moving like normal.  If you don't, get you something to eat and drink and lay down and focus on feeling your baby move.   If your baby is still not moving like normal, you should call the office or go to [facility].  Call the office [phone]) or go to [facility] hospital for these signs of pre-eclampsia: Severe headache that does not go away with Tylenol Visual changes- seeing spots, double, blurred vision Pain under your right breast or upper abdomen that does not go away with Tums or heartburn medicine Nausea and/or vomiting Severe swelling in your hands, feet, and face   [facility] Pediatricians/Family Doctors [facility]): [address] [place], [place]: [address] [place], [place] [facility]): [place], [phone] (call to ask if accepting patients) [facility] Department: [address] [place], [place] [facility]): [address] [place], [address], [place] [facility]: [address] [place], [place] [facility]: [place], [place] [facility]): [phone] [facility]: [address], [place]: [address] [place] [facility]: [phone], [phone]  Home Blood Pressure Monitoring for Patients   Your provider has recommended that you check your blood pressure (BP) at least once a week at home. If you do not have a blood pressure cuff at home, one will be provided for you. Contact your provider if you have not received your monitor within 1 week.   Helpful Tips for Accurate Home Blood Pressure Checks  Don't smoke, exercise, or drink caffeine 30 minutes before checking your BP Use the restroom before checking your BP (a full bladder can raise your pressure) Relax in a comfortable upright chair Feet on the ground Left arm resting comfortably on a flat surface at the level of your heart Legs uncrossed Back supported Sit quietly and don't talk Place the cuff on your bare arm Adjust snuggly, so that only two fingertips  can fit between your skin and the top of the cuff Check 2 readings separated by at least one minute Keep a log of your BP readings For a visual, please reference this diagram: http://ccnc.care/bpdiagram  Provider Name: [facility]     Phone: [phone]  Zone 1: ALL CLEAR  Continue to monitor your symptoms:  BP reading is less than 140 (top number) or less than 90 (bottom number)  No right  upper stomach pain No headaches or seeing spots No feeling nauseated or throwing up No swelling in face and hands  Zone 2: CAUTION Call your doctor's office for any of the following:  BP reading is greater than 140 (top number) or greater than 90 (bottom number)  Stomach pain under your ribs in the middle or right side Headaches or seeing spots Feeling nauseated or throwing up Swelling in face and hands  Zone 3: EMERGENCY  Seek immediate medical care if you have any of the following:  BP reading is greater than160 (top number) or greater than 110 (bottom number) Severe headaches not improving with Tylenol Serious difficulty catching your breath Any worsening symptoms from Zone 2   Braxton Hicks Contractions Contractions of the uterus can occur throughout pregnancy, but they are not always a sign that you are in labor. You may have practice contractions called Braxton Hicks contractions. These false labor contractions are sometimes confused with true labor. What are [place] [address] contractions? Braxton Hicks contractions are tightening movements that occur in the muscles of the uterus before labor. Unlike true labor contractions, these contractions do not result in opening (dilation) and thinning of the cervix. Toward the end of pregnancy (32-34 weeks), Braxton Hicks contractions can happen more often and may become stronger. These contractions are sometimes difficult to tell apart from true labor because they can be very uncomfortable. You should not feel embarrassed if you go to the hospital with false labor. Sometimes, the only way to tell if you are in true labor is for your health care provider to look for changes in the cervix. The health care provider will do a physical exam and may monitor your contractions. If you are not in true labor, the exam should show that your cervix is not dilating and your water has not broken. If there are no other health problems associated with your  pregnancy, it is completely safe for you to be sent home with false labor. You may continue to have Braxton Hicks contractions until you go into true labor. How to tell the difference between true labor and false labor True labor Contractions last 30-70 seconds. Contractions become very regular. Discomfort is usually felt in the top of the uterus, and it spreads to the lower abdomen and low back. Contractions do not go away with walking. Contractions usually become more intense and increase in frequency. The cervix dilates and gets thinner. False labor Contractions are usually shorter and not as strong as true labor contractions. Contractions are usually irregular. Contractions are often felt in the front of the lower abdomen and in the groin. Contractions may go away when you walk around or change positions while lying down. Contractions get weaker and are shorter-lasting as time goes on. The cervix usually does not dilate or become thin. Follow these instructions at home:  Take over-the-counter and prescription medicines only as told by your health care provider. Keep up with your usual exercises and follow other instructions from your health care provider. Eat and drink lightly if you think  you are going into labor. If Braxton Hicks contractions are making you uncomfortable: Change your position from lying down or resting to walking, or change from walking to resting. Sit and rest in a tub of warm water. Drink enough fluid to keep your urine pale yellow. Dehydration may cause these contractions. Do slow and deep breathing several times an hour. Keep all follow-up prenatal visits as told by your health care provider. This is important. Contact a health care provider if: You have a fever. You have continuous pain in your abdomen. Get help right away if: Your contractions become stronger, more regular, and closer together. You have fluid leaking or gushing from your vagina. You pass  blood-tinged mucus (bloody show). You have bleeding from your vagina. You have low back pain that you never had before. You feel your baby's head pushing down and causing pelvic pressure. Your baby is not moving inside you as much as it used to. Summary Contractions that occur before labor are called Braxton Hicks contractions, false labor, or practice contractions. Braxton Hicks contractions are usually shorter, weaker, farther apart, and less regular than true labor contractions. True labor contractions usually become progressively stronger and regular, and they become more frequent. Manage discomfort from [facility] contractions by changing position, resting in a warm bath, drinking plenty of water, or practicing deep breathing. This information is not intended to replace advice given to you by your health care provider. Make sure you discuss any questions you have with your health care provider. Document Revised: 09/01/2017 Document Reviewed: 02/02/2017 Elsevier Patient Education  [place].

## 2024-07-11 NOTE — Telephone Encounter (Signed)
 Preadmission screen

## 2024-07-12 ENCOUNTER — Inpatient Hospital Stay (HOSPITAL_COMMUNITY)
Admission: AD | Admit: 2024-07-12 | Discharge: 2024-07-14 | DRG: 807 | Disposition: A | Discharge status: Home / Self Care | Attending: Obstetrics & Gynecology | Admitting: Obstetrics & Gynecology

## 2024-07-12 ENCOUNTER — Encounter (HOSPITAL_COMMUNITY): Payer: Self-pay | Admitting: Obstetrics & Gynecology

## 2024-07-12 DIAGNOSIS — O0993 Supervision of high risk pregnancy, unspecified, third trimester: Secondary | ICD-10-CM

## 2024-07-12 DIAGNOSIS — Z6841 Body Mass Index (BMI) 40.0 and over, adult: Principal | ICD-10-CM

## 2024-07-12 DIAGNOSIS — Z3A39 39 weeks gestation of pregnancy: Secondary | ICD-10-CM

## 2024-07-12 NOTE — MAU Note (Signed)
 MAU Labor Triage Note:  .[name] is a 23 y.o. at [redacted]w[redacted]d here in MAU reporting:  Contractions every: 3-5 minutes Onset of ctx: today Pain Score: 8  Pain Location: Abdomen  ROM: denies Vaginal Bleeding: bloody show Last SVE: Thursday was 1.5 cm Labor Pain Management Plan: Planning epidural  GBS: Negative  Fetal Movement: Reports positive FM FHT:  Bypass triage, taken directly to room  Vitals:   07/12/24 2310  BP: 116/65  Pulse: 98  Resp: 18  Temp: 97.8 F (36.6 C)  SpO2: 100%      Lab orders placed from triage: MAU Labor Eval OB Office: Faculty

## 2024-07-13 ENCOUNTER — Inpatient Hospital Stay (HOSPITAL_COMMUNITY): Admission: AD | Admit: 2024-07-13 | Source: Home / Self Care

## 2024-07-13 ENCOUNTER — Other Ambulatory Visit: Payer: Self-pay

## 2024-07-13 ENCOUNTER — Encounter (HOSPITAL_COMMUNITY): Payer: Self-pay | Admitting: Obstetrics & Gynecology

## 2024-07-13 ENCOUNTER — Inpatient Hospital Stay (HOSPITAL_COMMUNITY): Admitting: Anesthesiology

## 2024-07-13 ENCOUNTER — Inpatient Hospital Stay (HOSPITAL_COMMUNITY)

## 2024-07-13 DIAGNOSIS — J45909 Unspecified asthma, uncomplicated: Secondary | ICD-10-CM | POA: Diagnosis not present

## 2024-07-13 DIAGNOSIS — Z3A39 39 weeks gestation of pregnancy: Secondary | ICD-10-CM | POA: Diagnosis not present

## 2024-07-13 DIAGNOSIS — O9952 Diseases of the respiratory system complicating childbirth: Secondary | ICD-10-CM | POA: Diagnosis not present

## 2024-07-13 DIAGNOSIS — O26893 Other specified pregnancy related conditions, third trimester: Secondary | ICD-10-CM | POA: Diagnosis not present

## 2024-07-13 LAB — CBC
HCT: 33.7 % — ABNORMAL LOW (ref 36.0–46.0)
HCT: 37.9 % (ref 36.0–46.0)
Hemoglobin: 11.2 g/dL — ABNORMAL LOW (ref 12.0–15.0)
Hemoglobin: 12.6 g/dL (ref 12.0–15.0)
MCH: 27.6 pg (ref 26.0–34.0)
MCH: 27.8 pg (ref 26.0–34.0)
MCHC: 33.2 g/dL (ref 30.0–36.0)
MCHC: 33.2 g/dL (ref 30.0–36.0)
MCV: 83 fL (ref 80.0–100.0)
MCV: 83.5 fL (ref 80.0–100.0)
Platelets: 220 K/uL (ref 150–400)
Platelets: 229 K/uL (ref 150–400)
RBC: 4.06 MIL/uL (ref 3.87–5.11)
RBC: 4.54 MIL/uL (ref 3.87–5.11)
RDW: 15.9 % — ABNORMAL HIGH (ref 11.5–15.5)
RDW: 15.9 % — ABNORMAL HIGH (ref 11.5–15.5)
WBC: 12.4 K/uL — ABNORMAL HIGH (ref 4.0–10.5)
WBC: 16.9 K/uL — ABNORMAL HIGH (ref 4.0–10.5)
nRBC: 0 % (ref 0.0–0.2)
nRBC: 0 % (ref 0.0–0.2)

## 2024-07-13 LAB — TYPE AND SCREEN
ABO/RH(D): A POS
Antibody Screen: NEGATIVE

## 2024-07-13 LAB — RPR: RPR Ser Ql: NONREACTIVE

## 2024-07-13 MED ORDER — OXYTOCIN-SODIUM CHLORIDE 30-0.9 UT/500ML-% IV SOLN
2.5000 [IU]/h | INTRAVENOUS | Status: DC
  Administered 2024-07-13: 2.5 [IU]/h via INTRAVENOUS
  Filled 2024-07-13: qty 500

## 2024-07-13 MED ORDER — PHENYLEPHRINE 80 MCG/ML (10ML) SYRINGE FOR IV PUSH (FOR BLOOD PRESSURE SUPPORT): 80.0000 ug | PREFILLED_SYRINGE | INTRAVENOUS | Status: DC | PRN

## 2024-07-13 MED ORDER — HYDROXYZINE HCL 50 MG PO TABS: 50.0000 mg | ORAL_TABLET | Freq: Four times a day (QID) | ORAL | Status: DC | PRN

## 2024-07-13 MED ORDER — SIMETHICONE 80 MG PO CHEW
80.0000 mg | CHEWABLE_TABLET | ORAL | Status: DC | PRN
  Administered 2024-07-13: 80 mg via ORAL
  Filled 2024-07-13: qty 1

## 2024-07-13 MED ORDER — IBUPROFEN 600 MG PO TABS
600.0000 mg | ORAL_TABLET | Freq: Four times a day (QID) | ORAL | Status: DC
  Administered 2024-07-13 – 2024-07-14 (×5): 600 mg via ORAL
  Filled 2024-07-13 (×6): qty 1

## 2024-07-13 MED ORDER — TERBUTALINE SULFATE 1 MG/ML IJ SOLN: 0.2500 mg | Freq: Once | INTRAMUSCULAR | Status: DC | PRN

## 2024-07-13 MED ORDER — ONDANSETRON HCL 4 MG PO TABS: 4.0000 mg | ORAL_TABLET | ORAL | Status: DC | PRN

## 2024-07-13 MED ORDER — DIPHENHYDRAMINE HCL 50 MG/ML IJ SOLN: 12.5000 mg | INTRAMUSCULAR | Status: DC | PRN

## 2024-07-13 MED ORDER — OXYTOCIN-SODIUM CHLORIDE 30-0.9 UT/500ML-% IV SOLN: 1.0000 m[IU]/min | INTRAVENOUS | Status: DC

## 2024-07-13 MED ORDER — SENNOSIDES-DOCUSATE SODIUM 8.6-50 MG PO TABS
2.0000 | ORAL_TABLET | Freq: Every day | ORAL | Status: DC
Start: 2024-07-14 — End: 2024-07-14
  Administered 2024-07-14: 2 via ORAL
  Filled 2024-07-13: qty 2

## 2024-07-13 MED ORDER — SOD CITRATE-CITRIC ACID 500-334 MG/5ML PO SOLN: 30.0000 mL | ORAL | Status: DC | PRN

## 2024-07-13 MED ORDER — TETANUS-DIPHTH-ACELL PERTUSSIS 5-2-15.5 LF-MCG/0.5 IM SUSP: 0.5000 mL | Freq: Once | INTRAMUSCULAR | Status: DC

## 2024-07-13 MED ORDER — LACTATED RINGERS IV SOLN: 500.0000 mL | Freq: Once | INTRAVENOUS | Status: DC

## 2024-07-13 MED ORDER — EPHEDRINE 5 MG/ML INJ: 10.0000 mg | INTRAVENOUS | Status: DC | PRN

## 2024-07-13 MED ORDER — ONDANSETRON HCL 4 MG/2ML IJ SOLN: 4.0000 mg | INTRAMUSCULAR | Status: DC | PRN

## 2024-07-13 MED ORDER — PRENATAL MULTIVITAMIN CH
1.0000 | ORAL_TABLET | Freq: Every day | ORAL | Status: DC
  Administered 2024-07-13 – 2024-07-14 (×2): 1 via ORAL
  Filled 2024-07-13 (×2): qty 1

## 2024-07-13 MED ORDER — WITCH HAZEL-GLYCERIN EX PADS: 1.0000 | MEDICATED_PAD | CUTANEOUS | Status: DC | PRN

## 2024-07-13 MED ORDER — DIPHENHYDRAMINE HCL 25 MG PO CAPS: 25.0000 mg | ORAL_CAPSULE | Freq: Four times a day (QID) | ORAL | Status: DC | PRN

## 2024-07-13 MED ORDER — LIDOCAINE HCL (PF) 1 % IJ SOLN
INTRAMUSCULAR | Status: DC | PRN
  Administered 2024-07-13: 5 mL via EPIDURAL

## 2024-07-13 MED ORDER — LACTATED RINGERS IV SOLN: INTRAVENOUS | Status: DC

## 2024-07-13 MED ORDER — LIDOCAINE HCL (PF) 1 % IJ SOLN: 30.0000 mL | INTRAMUSCULAR | Status: DC | PRN

## 2024-07-13 MED ORDER — LACTATED RINGERS IV SOLN
500.0000 mL | INTRAVENOUS | Status: DC | PRN
  Administered 2024-07-13: 1000 mL via INTRAVENOUS

## 2024-07-13 MED ORDER — OXYTOCIN BOLUS FROM INFUSION
333.0000 mL | Freq: Once | INTRAVENOUS | Status: AC
  Administered 2024-07-13: 333 mL via INTRAVENOUS

## 2024-07-13 MED ORDER — BENZOCAINE-MENTHOL 20-0.5 % EX AERO
1.0000 | INHALATION_SPRAY | CUTANEOUS | Status: DC | PRN
  Administered 2024-07-13: 1 via TOPICAL
  Filled 2024-07-13: qty 56

## 2024-07-13 MED ORDER — ACETAMINOPHEN 325 MG PO TABS
650.0000 mg | ORAL_TABLET | ORAL | Status: DC | PRN
  Administered 2024-07-13 – 2024-07-14 (×2): 650 mg via ORAL
  Filled 2024-07-13 (×2): qty 2

## 2024-07-13 MED ORDER — COCONUT OIL OIL
1.0000 | TOPICAL_OIL | Status: DC | PRN
  Administered 2024-07-13 – 2024-07-14 (×2): 1 via TOPICAL

## 2024-07-13 MED ORDER — FENTANYL-BUPIVACAINE-NACL 0.5-0.125-0.9 MG/250ML-% EP SOLN
EPIDURAL | Status: AC
  Filled 2024-07-13: qty 250

## 2024-07-13 MED ORDER — OXYCODONE-ACETAMINOPHEN 5-325 MG PO TABS: 2.0000 | ORAL_TABLET | ORAL | Status: DC | PRN

## 2024-07-13 MED ORDER — ZOLPIDEM TARTRATE 5 MG PO TABS: 5.0000 mg | ORAL_TABLET | Freq: Every evening | ORAL | Status: DC | PRN

## 2024-07-13 MED ORDER — ACETAMINOPHEN 325 MG PO TABS: 650.0000 mg | ORAL_TABLET | ORAL | Status: DC | PRN

## 2024-07-13 MED ORDER — FENTANYL-BUPIVACAINE-NACL 0.5-0.125-0.9 MG/250ML-% EP SOLN
12.0000 mL/h | EPIDURAL | Status: DC | PRN
  Administered 2024-07-13: 12 mL/h via EPIDURAL

## 2024-07-13 MED ORDER — ONDANSETRON HCL 4 MG/2ML IJ SOLN
4.0000 mg | Freq: Four times a day (QID) | INTRAMUSCULAR | Status: DC | PRN
  Administered 2024-07-13: 4 mg via INTRAVENOUS
  Filled 2024-07-13: qty 2

## 2024-07-13 MED ORDER — DIBUCAINE (PERIANAL) 1 % EX OINT: 1.0000 | TOPICAL_OINTMENT | CUTANEOUS | Status: DC | PRN

## 2024-07-13 MED ORDER — FENTANYL CITRATE (PF) 100 MCG/2ML IJ SOLN
100.0000 ug | INTRAMUSCULAR | Status: DC | PRN
  Administered 2024-07-13: 50 ug via INTRAVENOUS
  Filled 2024-07-13: qty 2

## 2024-07-13 MED ORDER — FLEET ENEMA RE ENEM: 1.0000 | ENEMA | RECTAL | Status: DC | PRN

## 2024-07-13 MED ORDER — OXYCODONE-ACETAMINOPHEN 5-325 MG PO TABS: 1.0000 | ORAL_TABLET | ORAL | Status: DC | PRN

## 2024-07-13 NOTE — Anesthesia Postprocedure Evaluation (Signed)
 Anesthesia Post Note  Patient: Yvonne Ayala  Procedure(s) Performed: AN AD HOC LABOR EPIDURAL     Patient location during evaluation: Mother Baby Anesthesia Type: Epidural Level of consciousness: awake and alert and oriented Pain management: satisfactory to patient Vital Signs Assessment: post-procedure vital signs reviewed and stable Respiratory status: respiratory function stable Cardiovascular status: stable Postop Assessment: no headache, no backache, epidural receding, patient able to bend at knees, no signs of nausea or vomiting, adequate PO intake and able to ambulate Anesthetic complications: no   No notable events documented.  Last Vitals:  Vitals:   07/13/24 0615 07/13/24 0756  BP: 97/63 110/61  Pulse: 75 75  Resp: 18 18  Temp: 36.7 C 36.8 C  SpO2: 97% 98%    Last Pain:  Vitals:   07/13/24 0802  TempSrc:   PainSc: 3    Pain Goal: Patients Stated Pain Goal: 0 (07/13/24 0802)                 PURNELL PORTAL

## 2024-07-13 NOTE — Anesthesia Procedure Notes (Signed)
 Epidural Patient location during procedure: OB Start time: 07/13/2024 2:20 AM End time: 07/13/2024 2:34 AM  Staffing Anesthesiologist: Jefm Garnette LABOR, MD Performed: anesthesiologist   Preanesthetic Checklist Completed: patient identified, IV checked, site marked, risks and benefits discussed, surgical consent, monitors and equipment checked, pre-op evaluation and timeout performed  Epidural Patient position: sitting Prep: DuraPrep and site prepped and draped Patient monitoring: continuous pulse ox and blood pressure Approach: midline Location: L3-L4 Injection technique: LOR air  Needle:  Needle type: Tuohy  Needle gauge: 17 G Needle length: 9 cm and 9 Needle insertion depth: 8 cm Catheter type: closed end flexible Catheter size: 19 Gauge Catheter at skin depth: 14 cm Test dose: negative  Assessment Events: blood not aspirated, no cerebrospinal fluid, injection not painful, no injection resistance, no paresthesia and negative IV test  Additional Notes Patient identified. Risks/Benefits/Options discussed with patient including but not limited to bleeding, infection, nerve damage, paralysis, failed block, incomplete pain control, headache, blood pressure changes, nausea, vomiting, reactions to medication both or allergic, itching and postpartum back pain. Confirmed with bedside nurse the patient's most recent platelet count. Confirmed with patient that they are not currently taking any anticoagulation, have any bleeding history or any family history of bleeding disorders. Patient expressed understanding and wished to proceed. All questions were answered. Sterile technique was used throughout the entire procedure. Please see nursing notes for vital signs. Test dose was given through epidural needle and negative prior to continuing to dose epidural or start infusion. Warning signs of high block given to the patient including shortness of breath, tingling/numbness in hands, complete  motor block, or any concerning symptoms with instructions to call for help. Patient was given instructions on fall risk and not to get out of bed. All questions and concerns addressed with instructions to call with any issues.  1 Attempt (S) . Patient tolerated procedure well.

## 2024-07-13 NOTE — Lactation Note (Signed)
 This note was copied from a baby's chart. Lactation Consultation Note  Patient Name: Yvonne Ayala Date: 07/13/2024 Age:25 hours, P3  Reason for consult: Initial assessment Per mom the baby started to feed at 6:16 and just released when [facility] went check the latch and fell asleep, Fed 7 mins.  Per mom was shown how to hand express by the nurse.  LC updated the most recent feeding.  LC reviewed breast feeding goals for 24 hours - feed with cues and by 3 hours offer the breast STS .   Maternal Data Has patient been taught Hand Expression?: Yes (per mom by nurse) Does the patient have breastfeeding experience prior to this delivery?: Yes How long did the patient breastfeed?: per mom 1st baby wwouldn't latch, 2nd baby 2 months and milk supply decreased and she stopped breast feeding  Feeding Mother's Current Feeding Choice: Breast Milk  LATCH Score - none as of yet      Lactation Tools Discussed/Used  None as of yet   Interventions Interventions: Breast feeding basics reviewed;Education;LC Services brochure;CDC milk storage guidelines;CDC Guidelines for Breast Pump Cleaning  Discharge Pump: Personal;Hands Free;Manual (per mom Hands free [place])  Consult Status Consult Status: Follow-up Date: 07/14/24 Follow-up type: In-patient    Rollene Caldron Edgard Debord 07/13/2024, 6:42 PM

## 2024-07-13 NOTE — Discharge Summary (Shared)
 Postpartum Discharge Summary  Date of Service updated***     Patient Name: Yvonne Ayala DOB: [birth year] MRN: [id number]  Date of admission: 07/12/2024 Delivery date:07/13/2024 Delivering provider: JOMARIE CAMPI A Date of discharge: 07/13/2024  Admitting diagnosis: BMI 40.0-44.9, adult (HCC) [Z68.41] Intrauterine pregnancy: [redacted]w[redacted]d     Secondary diagnosis:  Principal Problem:   BMI 40.0-44.9, adult [facility])  Additional problems: ***    Discharge diagnosis: Term Pregnancy Delivered                                              Post partum procedures:none Augmentation: N/A Complications: None  Hospital course: Onset of Labor With Vaginal Delivery      23 y.o. yo [id number] at [redacted]w[redacted]d was admitted in Active Labor on 07/12/2024. Labor course was uncomplicated   Membrane Rupture Time/Date:  ,   Delivery Method:Vaginal, Spontaneous Operative Delivery:N/A Episiotomy: None Lacerations:  None Patient had a postpartum course complicated by ***.  She is ambulating, tolerating a regular diet, passing flatus, and urinating well. Patient is discharged home in stable condition on 07/13/24.  Newborn Data: Birth date:07/13/2024 Birth time:4:18 AM Gender:Female Living status:Living Apgars:8 ,9  Weight:   Magnesium  Sulfate received: No BMZ received: No Rhophylac:N/A MMR:N/A T-DaP:Given prenatally Flu: No RSV Vaccine received: No Transfusion:No  Immunizations received: Immunization History  Administered Date(s) Administered   Influenza,inj,Quad PF,6+ Mos 10/16/2017   Tdap 03/16/2018, 11/27/2020, 05/29/2024    Physical exam  Vitals:   07/13/24 0300 07/13/24 0301 07/13/24 0305 07/13/24 0306  BP:  (!) 99/53  (!) 111/58  Pulse:  91  (!) 101  Resp:      Temp:      TempSrc:      SpO2: 98%  99%   Weight:      Height:       General: {Exam; general:21111117} Lochia: {Desc; appropriate/inappropriate:30686::appropriate} Uterine Fundus: {Desc; firm/soft:30687} Incision: {Exam;  incision:21111123} DVT Evaluation: {Exam; dvt:2111122} Labs: Lab Results  Component Value Date   WBC 12.4 (H) 07/13/2024   HGB 12.6 07/13/2024   HCT 37.9 07/13/2024   MCV 83.5 07/13/2024   PLT 220 07/13/2024       No data to display         Edinburgh Score:    03/29/2021    3:42 PM  Edinburgh Postnatal Depression Scale Screening Tool  I have been able to laugh and see the funny side of things. 0  I have looked forward with enjoyment to things. 0  I have blamed myself unnecessarily when things went wrong. 2  I have been anxious or worried for no good reason. 0  I have felt scared or panicky for no good reason. 2  Things have been getting on top of me. 1  I have been so unhappy that I have had difficulty sleeping. 0  I have felt sad or miserable. 0  I have been so unhappy that I have been crying. 0  The thought of harming myself has occurred to me. 0  Edinburgh Postnatal Depression Scale Total 5      Data saved with a previous flowsheet row definition   No data recorded  After visit meds:  Allergies as of 07/13/2024       Reactions   Vancomycin Hives, Itching     Med Rec must be completed prior to using this [facility]***  Discharge home in stable condition Infant Feeding: Breast Infant Disposition:home with mother Discharge instruction: per After Visit Summary and Postpartum booklet. Activity: Advance as tolerated. Pelvic rest for 6 weeks.  Diet: routine diet Future Appointments: Future Appointments  Date Time Provider Department Center  07/13/2024  7:15 AM [facility] SCHED ROOM [facility] None   Follow up Visit:   Please schedule this patient for a In person postpartum visit in 6 weeks with the following provider: Any provider. Additional Postpartum F/U:None  Low risk pregnancy Delivery mode:  Vaginal, Spontaneous Anticipated Birth Control:  Unsure  Message sent to [facility] 10/11  07/13/2024 Charlie DELENA Courts, MD

## 2024-07-13 NOTE — H&P (Signed)
 OBSTETRIC ADMISSION HISTORY AND PHYSICAL  Yvonne Ayala is a 23 y.o. female G70P2002 with IUP at [redacted]w[redacted]d by LMP c/w 8wk US  presenting for SOL. She reports +FMs, No LOF, no VB, no blurry vision, headaches or peripheral edema, and RUQ pain.  She plans on breast feeding. She is undecided for birth control. She received her prenatal care at [facility]   Dating: By LMP c/w 8wk US  --->  Estimated Date of Delivery: 07/14/24  Sono:    @[redacted]w[redacted]d , CWD, normal anatomy, cephalic presentation, 3495g, 19% EFW   Prenatal History/Complications: BMI 40  Past Medical History: Past Medical History:  Diagnosis Date   Asthma    as a child   MRSA infection    at age 16 and hospitalized    Past Surgical History: Past Surgical History:  Procedure Laterality Date   WISDOM TOOTH EXTRACTION      Obstetrical History: OB History     Gravida  3   Para  2   Term  2   Preterm      AB      Living  2      SAB      IAB      Ectopic      Multiple  0   Live Births  2           Social History Social History   Socioeconomic History   Marital status: Single    Spouse name: Not on file   Number of children: Not on file   Years of education: Not on file   Highest education level: Not on file  Occupational History   Not on file  Tobacco Use   Smoking status: Never   Smokeless tobacco: Never  Vaping Use   Vaping status: Former   Devices: 3months prior to [employer]  Substance and Sexual Activity   Alcohol use: Not Currently   Drug use: No   Sexual activity: Yes    Birth control/protection: None  Other Topics Concern   Not on file  Social History Narrative   Not on file   Social Drivers of Health   Financial Resource Strain: Low Risk  (01/01/2024)   Overall Financial Resource Strain (CARDIA)    Difficulty of Paying Living Expenses: Not hard at all  Food Insecurity: No Food Insecurity (01/01/2024)   Hunger Vital Sign    Worried About Running Out of Food in the Last Year: Never  true    Ran Out of Food in the Last Year: Never true  Transportation Needs: No Transportation Needs (01/01/2024)   PRAPARE - Administrator, Civil Service (Medical): No    Lack of Transportation (Non-Medical): No  Physical Activity: Insufficiently Active (01/01/2024)   Exercise Vital Sign    Days of Exercise per Week: 1 day    Minutes of Exercise per Session: 10 min  Stress: No Stress Concern Present (01/01/2024)   [employer] of Occupational Health - Occupational Stress Questionnaire    Feeling of Stress : Only a little  Social Connections: Socially Integrated (01/01/2024)   Social Connection and Isolation Panel    Frequency of Communication with Friends and Family: More than three times a week    Frequency of Social Gatherings with Friends and Family: Twice a week    Attends Religious Services: 1 to 4 times per year    Active Member of [employer] or Organizations: Yes    Attends Banker Meetings: 1 to 4 times per year  Marital Status: Married    Family History: Family History  Adopted: Yes    Allergies: Allergies  Allergen Reactions   Vancomycin Hives and Itching    Medications Prior to Admission  Medication Sig Dispense Refill Last Dose/Taking   Prenatal Vit-Fe Fumarate-FA (PRENATAL PLUS VITAMIN/MINERAL) 27-1 MG TABS Take 1 tablet by mouth daily. 30 tablet 12      Review of Systems   All systems reviewed and negative except as stated in HPI  Blood pressure 116/65, pulse 98, temperature 97.8 F (36.6 C), temperature source Oral, resp. rate 18, height 5' 8.5 (1.74 m), weight 125 kg, last menstrual period 10/08/2023, SpO2 100%. General appearance: alert, cooperative, appears stated age, and no distress Lungs: clear to auscultation bilaterally Heart: regular rate and rhythm Abdomen: soft, non-tender; bowel sounds normal Extremities: Homans sign is negative, no sign of DVT Presentation: cephalic Fetal monitoringBaseline: 130 bpm, Variability:  Good {> 6 bpm), Accelerations: Reactive, and Decelerations: Absent Uterine activityFrequency: Every 5 minutes Dilation: 5.5 Effacement (%): 50 Station: -2 Exam by:: Benton Medley, RN   Prenatal labs: ABO, Rh: A/Positive/-- (03/31 1220) Antibody: Negative (07/23 0837) Rubella: 21.30 (03/31 1220) RPR: Non Reactive (07/23 0837)  HBsAg: Negative (03/31 1220)  HIV: Non Reactive (07/23 0837)  GBS: Negative/-- (09/18 1425)    Lab Results  Component Value Date   GBS Negative 06/20/2024   GTT normal Genetic screening  low-risk Anatomy US  normal  Immunization History  Administered Date(s) Administered   Influenza,inj,Quad PF,6+ Mos 10/16/2017   Tdap 03/16/2018, 11/27/2020, 05/29/2024    Prenatal Transfer Tool  Maternal Diabetes: No Genetic Screening: Normal Maternal Ultrasounds/Referrals: Normal Fetal Ultrasounds or other Referrals:  None Maternal Substance Abuse:  No Significant Maternal Medications:  None Significant Maternal Lab Results: Group B Strep negative Number of Prenatal Visits:greater than 3 verified prenatal visits Maternal Vaccinations:TDap Other Comments:  None   No results found for this or any previous visit (from the past 24 hours).  Patient Active Problem List   Diagnosis Date Noted   Supervision of high risk pregnancy, antepartum 01/01/2024   BMI 40.0-44.9, adult (HCC) 01/01/2024   Postpartum depression/anxiety 03/29/2021   ADHD 10/16/2017    Assessment/Plan:  Yvonne Ayala is a 23 y.o. G3P2002 at [redacted]w[redacted]d here for SOL  #Labor:Expectant management #Pain: Epidural in place #FWB: Category I #GBS status:  negative #Feeding: Breastmilk  #Reproductive Life planning: Undecided #Circ:  yes  Charlie DELENA Courts, MD  07/13/2024, 12:27 AM

## 2024-07-13 NOTE — Anesthesia Preprocedure Evaluation (Addendum)
 Anesthesia Evaluation  Patient identified by MRN, date of birth, ID band Patient awake    Reviewed: Allergy & Precautions, NPO status , Patient's Chart, lab work & pertinent test results  Airway Mallampati: III  TM Distance: >3 FB Neck ROM: Full    Dental no notable dental hx. (+) Teeth Intact, Dental Advisory Given   Pulmonary asthma    Pulmonary exam normal breath sounds clear to auscultation       Cardiovascular negative cardio ROS Normal cardiovascular exam Rhythm:Regular Rate:Normal     Neuro/Psych    GI/Hepatic negative GI ROS, Neg liver ROS,,,  Endo/Other    Renal/GU negative Renal ROS     Musculoskeletal negative musculoskeletal ROS (+)    Abdominal  (+) + obese  Peds  Hematology Lab Results      Component                Value               Date                      WBC                      12.4 (H)            07/13/2024                HGB                      12.6                07/13/2024                HCT                      37.9                07/13/2024                MCV                      83.5                07/13/2024                PLT                      220                 07/13/2024              Anesthesia Other Findings All : vancomycin  Reproductive/Obstetrics (+) Pregnancy                              Anesthesia Physical Anesthesia Plan  ASA: 3  Anesthesia Plan: Epidural   Post-op Pain Management:    Induction:   PONV Risk Score and Plan:   Airway Management Planned:   Additional Equipment:   Intra-op Plan:   Post-operative Plan:   Informed Consent: I have reviewed the patients History and Physical, chart, labs and discussed the procedure including the risks, benefits and alternatives for the proposed anesthesia with the patient or authorized representative who has indicated his/her understanding and acceptance.       Plan Discussed with:    Anesthesia Plan Comments: (39.6 wk  G3P2 w BMI 41.3 for LEA)         Anesthesia Quick Evaluation

## 2024-07-14 ENCOUNTER — Other Ambulatory Visit (HOSPITAL_COMMUNITY): Payer: Self-pay

## 2024-07-14 MED ORDER — ACETAMINOPHEN 500 MG PO TABS
1000.0000 mg | ORAL_TABLET | Freq: Four times a day (QID) | ORAL | 0 refills | Status: AC | PRN
  Filled 2024-07-14: qty 60, 8d supply, fill #0

## 2024-07-14 MED ORDER — IBUPROFEN 600 MG PO TABS
600.0000 mg | ORAL_TABLET | Freq: Four times a day (QID) | ORAL | 0 refills | Status: AC
  Filled 2024-07-14: qty 30, 8d supply, fill #0

## 2024-07-14 NOTE — Progress Notes (Signed)
 CSW was consulted due to history of postpartum depression,anxiety, and ADHD noted in patient's chart. CSW attempted to meet with MOB at bedside to complete assessment and provide support. When CSW entered room, MOB was observed sitting in bed nursing infant. CSW introduced self and explained reason for visit. MOB presented as calm and pleasant. MOB denied a history of anxiety and/or depression and politely declined social work consult. MOB was accepting of New Mom Checklist from Postpartum Progress for self evaluation of PMADs symptoms. ADHD diagnosis was not addressed. CSW assessed for safety. MOB denied current SI/HI/domestic violence. MOB denied additional social work needs at this time.  No barriers to infant's discharge.  Signed,  Sharyne LOIS Roulette, MSW, LCSWA, LCASA 05-23-24 10:17 AM

## 2024-07-23 ENCOUNTER — Encounter: Admitting: Women's Health

## 2024-07-23 ENCOUNTER — Telehealth (HOSPITAL_COMMUNITY): Payer: Self-pay | Admitting: *Deleted

## 2024-07-23 NOTE — Telephone Encounter (Signed)
 07/23/2024  Name: Yvonne Ayala MRN: [id number] DOB: 07/01/2001  Reason for Call:  Transition of Care Hospital Discharge Call  Contact Status: Patient Contact Status:  (incomplete, call dropped after initial questions, return call went to voice mail)  Language assistant needed: Interpreter Mode: Interpreter Not Needed        Follow-Up Questions: Do You Have Any Concerns About Your Health As You Heal From Delivery?: No Do You Have Any Concerns About Your Infants Health?: No  Edinburgh Postnatal Depression Scale:  In the Past 7 Days:    PHQ2-9 Depression Scale:     Discharge Follow-up:    Post-discharge interventions: NA  Mliss Sieve, RN 07/23/2024 11:27

## 2024-08-13 ENCOUNTER — Ambulatory Visit: Admitting: Women's Health

## 2024-09-02 ENCOUNTER — Other Ambulatory Visit (HOSPITAL_COMMUNITY): Payer: Self-pay

## 2024-09-03 ENCOUNTER — Ambulatory Visit: Admitting: Women's Health

## 2024-09-12 ENCOUNTER — Other Ambulatory Visit (HOSPITAL_COMMUNITY): Payer: Self-pay
